# Patient Record
Sex: Male | Born: 1950
Health system: Southern US, Community
[De-identification: ages and names within clinical notes are randomized; demographics above are authoritative.]

## PROBLEM LIST (undated history)

## (undated) DIAGNOSIS — K219 Gastro-esophageal reflux disease without esophagitis: Secondary | ICD-10-CM

## (undated) DIAGNOSIS — E785 Hyperlipidemia, unspecified: Secondary | ICD-10-CM

## (undated) DIAGNOSIS — T7840XA Allergy, unspecified, initial encounter: Secondary | ICD-10-CM

## (undated) HISTORY — DX: Gastro-esophageal reflux disease without esophagitis: K21.9

## (undated) HISTORY — PX: OTHER SURGICAL HISTORY: SHX169

## (undated) HISTORY — DX: Allergy, unspecified, initial encounter: T78.40XA

## (undated) HISTORY — PX: TONSILLECTOMY: SUR1361

## (undated) HISTORY — DX: Hyperlipidemia, unspecified: E78.5

## (undated) HISTORY — PX: MULTIPLE TOOTH EXTRACTIONS: SHX2053

---

## 2001-07-13 ENCOUNTER — Ambulatory Visit (HOSPITAL_COMMUNITY): Admission: RE | Admit: 2001-07-13 | Discharge: 2001-07-13 | Payer: Self-pay | Admitting: Internal Medicine

## 2001-07-13 ENCOUNTER — Encounter: Payer: Self-pay | Admitting: Internal Medicine

## 2005-12-18 ENCOUNTER — Ambulatory Visit (HOSPITAL_COMMUNITY): Admission: RE | Admit: 2005-12-18 | Discharge: 2005-12-18 | Payer: Self-pay | Admitting: Internal Medicine

## 2017-05-14 ENCOUNTER — Ambulatory Visit (INDEPENDENT_AMBULATORY_CARE_PROVIDER_SITE_OTHER): Payer: Self-pay | Admitting: Internal Medicine

## 2017-05-14 ENCOUNTER — Encounter: Payer: Self-pay | Admitting: Internal Medicine

## 2017-05-14 VITALS — BP 134/84 | HR 72 | Temp 97.6°F | Resp 16 | Ht 70.0 in | Wt 179.0 lb

## 2017-05-14 DIAGNOSIS — E782 Mixed hyperlipidemia: Secondary | ICD-10-CM

## 2017-05-14 DIAGNOSIS — G8921 Chronic pain due to trauma: Secondary | ICD-10-CM

## 2017-05-14 DIAGNOSIS — R0989 Other specified symptoms and signs involving the circulatory and respiratory systems: Secondary | ICD-10-CM

## 2017-05-14 DIAGNOSIS — R7309 Other abnormal glucose: Secondary | ICD-10-CM

## 2017-05-14 DIAGNOSIS — E349 Endocrine disorder, unspecified: Secondary | ICD-10-CM

## 2017-05-14 DIAGNOSIS — E559 Vitamin D deficiency, unspecified: Secondary | ICD-10-CM

## 2017-05-14 MED ORDER — TESTOSTERONE CYPIONATE 200 MG/ML IM SOLN: INTRAMUSCULAR | 2 refills | Status: DC

## 2017-05-14 MED ORDER — MELOXICAM 15 MG PO TABS: ORAL_TABLET | ORAL | 1 refills | Status: DC

## 2017-05-14 MED ORDER — PRAVASTATIN SODIUM 40 MG PO TABS: ORAL_TABLET | ORAL | 1 refills | Status: DC

## 2017-05-14 NOTE — Progress Notes (Signed)
Patient ID: Christian Dixon, male   DOB: Jan 23, 1951, 67 y.o.   MRN: [id number]      This very nice 67 y.o. MWM presents to re-establish on return to [place] after living in [place] for the last 10 years. He has been treated for HLD, HTN and Testosterone deficiency. Patient has been on self administered Testosterone replacement by Depo-Testosterone.      Patient's HTN has been labile and he's been on Terazosin for BP and LUTS.  He reports  BP has been controlled. Today's is 146/108 at the wrist and repeated with the wall cuff at 136/88.  Patient has had no complaints of any cardiac type chest pain, palpitations, dyspnea / orthopnea / PND, dizziness, claudication, or dependent edema.     Hyperlipidemia is controlled with diet & meds. Patient denies myalgias or other med SE's.  Records of last lipids brought in by his wife from July 2017 , showed T Chol 159, TG 74, HDL 38 and LDL 72.   Current Outpatient Medications on File Prior to Visit  Medication Sig  . cholecalciferol (VITAMIN D) 1000 units tablet Take 1,000 Units by mouth daily.  . Flaxseed, Linseed, (FLAXSEED OIL PO) Take 1,400 mg by mouth 2 (two) times daily.  . multivitamin (ONE-A-DAY MEN'S) TABS tablet Take 1 tablet by mouth daily.  . Omega-3 Fatty Acids (FISH OIL) 1200 MG CAPS Take by mouth 3 (three) times daily.  Marland Kitchen OVER THE COUNTER MEDICATION Takes allergy tablets PRN  . terazosin (HYTRIN) 10 MG capsule Take 10 mg by mouth at bedtime.   No current facility-administered medications on file prior to visit.    No Known Allergies   PMHx: (+) for labile HTN, HLD, Low Testosterone.   FHx:    Reviewed  SHx:    Reviewed  Systems Review: Constitutional: Denies fever, chills, wt changes, headaches, insomnia, fatigue, night sweats, change in appetite. Eyes: Denies redness, blurred vision, diplopia, discharge, itchy, watery eyes.  ENT: Denies discharge, congestion, post nasal drip, epistaxis, sore throat, earache, hearing loss, dental  pain, tinnitus, vertigo, sinus pain, snoring.  CV: Denies chest pain, palpitations, irregular heartbeat, syncope, dyspnea, diaphoresis, orthopnea, PND, claudication or edema. Respiratory: denies cough, dyspnea, DOE, pleurisy, hoarseness, laryngitis, wheezing.  Gastrointestinal: Denies dysphagia, odynophagia, heartburn, reflux, water brash, abdominal pain or cramps, nausea, vomiting, bloating, diarrhea, constipation, hematemesis, melena, hematochezia  or hemorrhoids. Genitourinary: Denies dysuria, frequency, urgency, nocturia, hesitancy, discharge, hematuria or flank pain. Musculoskeletal: Denies arthralgias, myalgias, stiffness, jt. swelling, pain, limping or strain/sprain.  Skin: Denies pruritus, rash, hives, warts, acne, eczema or change in skin lesion(s). Neuro: No weakness, tremor, incoordination, spasms, paresthesia or pain. Psychiatric: Denies confusion, memory loss or sensory loss. Endo: Denies change in weight, skin or hair change.  Heme/Lymph: No excessive bleeding, bruising or enlarged lymph nodes.  Physical Exam  BP 134/84   Pulse 72   Temp 97.6 F (36.4 C)   Resp 16   Ht 5\' 10"  (1.778 m)   Wt 179 lb (81.2 kg)   BMI 25.68 kg/m   Appears well nourished, well groomed  and in no distress.  Eyes: PERRLA, EOMs, conjunctiva no swelling or erythema. Sinuses: No frontal/maxillary tenderness ENT/Mouth: EAC's clear, TM's nl w/o erythema, bulging. Nares clear w/o erythema, swelling, exudates. Oropharynx clear without erythema or exudates. Oral hygiene is good. Tongue normal, non obstructing. Hearing intact.  Neck: Supple. Thyroid not palpable. Car 2+/2+ without bruits, nodes or JVD. Chest: Respirations nl with BS clear & equal w/o rales, rhonchi, wheezing or stridor.  Cor: Heart sounds normal w/ regular rate and rhythm without sig. murmurs, gallops, clicks or rubs. Peripheral pulses normal and equal  without edema.  Abdomen: Soft & bowel sounds normal. Non-tender w/o guarding,  rebound, hernias, masses or organomegaly.  Lymphatics: Unremarkable.  Musculoskeletal: Full ROM all peripheral extremities, joint stability, 5/5 strength and normal gait.  Skin: Warm, dry without exposed rashes, lesions or ecchymosis apparent.  Neuro: Cranial nerves intact, reflexes equal bilaterally. Sensory-motor testing grossly intact. Tendon reflexes grossly intact.  Pysch: Alert & oriented x 3.  Insight and judgement nl & appropriate. No ideations.  Assessment and Plan:  1. Labile hypertension  - monitor blood pressure at home.   - Reminder to go to the ER if any CP,  SOB, nausea, dizziness, severe HA, changes vision/speech.  2. Hyperlipidemia, mixed - Continue diet/meds, exercise,& lifestyle modifications.   - pravastatin (PRAVACHOL) 40 MG tablet; Take 1 tablet at nite for Cholesterol  Dispense: 90 tablet; Refill: 1  3. Abnormal glucose  - to be determined  4. Vitamin D deficiency  - recc Vit D 10,000 u/day  5. Testosterone deficiency  - testosterone cypionate (DEPOTESTOSTERONE CYPIONATE) 200 MG/ML injection; Inject 2 ml IM every 2 weeks  Dispense: 10 mL; Refill: 2  6. Chronic pain due to trauma  - meloxicam (MOBIC) 15 MG tablet; Take 1/2 to 1 tablet daily with food for pain & inflammation  Dispense: 90 tablet; Refill: 1      Discussed  regular exercise, BP monitoring, weight control to achieve/maintain BMI less than 25 and discussed med and SE's. Patient deferred any labs til he enrolls on Medicare in July this summer.

## 2017-05-14 NOTE — Patient Instructions (Signed)

## 2017-05-15 ENCOUNTER — Telehealth: Payer: Self-pay | Admitting: *Deleted

## 2017-05-15 MED ORDER — BENZONATATE 200 MG PO CAPS: 200.0000 mg | ORAL_CAPSULE | Freq: Three times a day (TID) | ORAL | 0 refills | Status: DC | PRN

## 2017-05-15 NOTE — Telephone Encounter (Signed)
Spouse called and asked about getting a cough medication for patient.  Per Dr Melford Aase, he can take OTC Delsym and RX for Tessalon perles called to the patient's pharmacy.  Spouse is aware.

## 2017-09-30 DIAGNOSIS — Z6826 Body mass index (BMI) 26.0-26.9, adult: Secondary | ICD-10-CM | POA: Insufficient documentation

## 2017-09-30 DIAGNOSIS — E349 Endocrine disorder, unspecified: Secondary | ICD-10-CM | POA: Insufficient documentation

## 2017-09-30 DIAGNOSIS — G8921 Chronic pain due to trauma: Secondary | ICD-10-CM | POA: Insufficient documentation

## 2017-09-30 DIAGNOSIS — R0989 Other specified symptoms and signs involving the circulatory and respiratory systems: Secondary | ICD-10-CM | POA: Insufficient documentation

## 2017-09-30 DIAGNOSIS — E782 Mixed hyperlipidemia: Secondary | ICD-10-CM | POA: Insufficient documentation

## 2017-09-30 DIAGNOSIS — R7309 Other abnormal glucose: Secondary | ICD-10-CM | POA: Insufficient documentation

## 2017-09-30 DIAGNOSIS — Z6825 Body mass index (BMI) 25.0-25.9, adult: Secondary | ICD-10-CM | POA: Insufficient documentation

## 2017-09-30 DIAGNOSIS — E559 Vitamin D deficiency, unspecified: Secondary | ICD-10-CM | POA: Insufficient documentation

## 2017-09-30 NOTE — Progress Notes (Addendum)
MEDICARE ANNUAL WELLNESS VISIT AND FOLLOW UP Assessment:   Diagnoses and all orders for this visit:  Welcome to Medicare preventive visit  Labile hypertension Continue medications Monitor blood pressure at home; call if consistently over 130/80 Continue DASH diet.   Reminder to go to the ER if any CP, SOB, nausea, dizziness, severe HA, changes vision/speech, left arm numbness and tingling and jaw pain.  Hyperlipidemia, mixed Continue medications: pravastatin Continue low cholesterol diet and exercise.  Check lipid panel.   Abnormal glucose Discussed disease and risks Discussed diet/exercise, weight management  A1C  Vitamin D deficiency Continue supplementation for goal of 70-100 Check vitamin D level  Testosterone deficiency continue to monitor, states medication is helping with symptoms of low T.   Chronic pain due to trauma Lower back pain; well managed by chiropractor visits and regular exercise  BMI 25.0-25.9,adult Continue to recommend diet heavy in fruits and veggies and low in animal meats, cheeses, and dairy products, appropriate calorie intake Discuss exercise recommendations routinely Continue to monitor weight at each visit  Former Smoker  EKG, [facility] [place]  Screening colon cancer Colonoscopy- patient declines a colonoscopy even though the risks and benefits were discussed at length. Colon cancer is 3rd most diagnosed cancer and 2nd leading cause of death in both men and women 77 years of age and older. Patient understands the risk of cancer and death with declining the test however they are willing to do cologuard screening instead. They understand that this is not as sensitive or specific as a colonoscopy and they are still recommended to get a colonoscopy. The cologuard will be sent out to their house.    Over 30 minutes of exam, counseling, chart review, and critical decision making was performed  Future Appointments  Date Time Provider [place]   04/22/2018 10:00 AM [name], MD [facility] None     Plan:   During the course of the visit the patient was educated and counseled about appropriate screening and preventive services including:    Pneumococcal vaccine   Influenza vaccine  Prevnar 13  Td vaccine  Screening electrocardiogram  Colorectal cancer screening  Diabetes screening  Glaucoma screening  Nutrition counseling    Subjective:  Christian Dixon is a 67 y.o. male newly established to our office after moving from [place] presents for welcome to medicare visit.   BMI is Body mass index is 26.11 kg/m., he has been working on diet and exercise. Wt Readings from Last 3 Encounters:  10/05/17 182 lb (82.6 kg)  05/14/17 179 lb (81.2 kg)   He has had no labs drawn at our office yet to date for review; we will draw baseline labs today for reported hx of labile hypertension, hyperlipidemia (on pravastatin, omega 3, flax seed), testosterone deficiency and vitamin D deficiency.   His blood pressure has been controlled at home, today their BP is BP: 134/86  He does workout. He denies chest pain, shortness of breath, dizziness.  He has a history of testosterone deficiency and is on testosterone replacement. He states that the testosterone helps with his energy, libido, muscle mass.     Medication Review:  Current Outpatient Medications (Endocrine & Metabolic):  .  testosterone cypionate (DEPOTESTOSTERONE CYPIONATE) 200 MG/ML injection, Inject 2 ml IM every 2 weeks  Current Outpatient Medications (Cardiovascular):  .  pravastatin (PRAVACHOL) 40 MG tablet, Take 1 tablet at nite for Cholesterol .  terazosin (HYTRIN) 10 MG capsule, Take 10 mg by mouth at bedtime.  Current Outpatient Medications (  Respiratory):  .  fluticasone (FLONASE) 50 MCG/ACT nasal spray, Place into both nostrils as needed for allergies or rhinitis. Marland Kitchen  benzonatate (TESSALON) 200 MG capsule, Take 1 capsule (200 mg total) by mouth 3  (three) times daily as needed for cough. (Patient not taking: Reported on 10/05/2017)  Current Outpatient Medications (Analgesics):  .  meloxicam (MOBIC) 15 MG tablet, Take 1/2 to 1 tablet daily with food for pain & inflammation   Current Outpatient Medications (Other):  .  cholecalciferol (VITAMIN D) 1000 units tablet, Take 1,000 Units by mouth daily. .  Flaxseed, Linseed, (FLAXSEED OIL PO), Take 1,400 mg by mouth 2 (two) times daily. .  multivitamin (ONE-A-DAY MEN'S) TABS tablet, Take 1 tablet by mouth daily. .  Omega-3 Fatty Acids (FISH OIL) 1200 MG CAPS, Take by mouth 3 (three) times daily. Marland Kitchen  OVER THE COUNTER MEDICATION, Takes allergy tablets PRN  Allergies: No Known Allergies  Current Problems (verified) has Labile hypertension; Hyperlipidemia, mixed; Abnormal glucose; Vitamin D deficiency; Testosterone deficiency; Chronic pain due to trauma; and BMI 25.0-25.9,adult on their problem list.  Screening Tests  There is no immunization history on file for this patient.  Preventative care: Last colonoscopy: never, has done cologard, will repeat  Prior vaccinations: TD or Tdap: has had in last 10 years, will request records  Influenza: declines  Pneumococcal: 1 year after prevnar Prevnar13: due next year Shingles/Zostavax: declines   Names of Other Physician/Practitioners you currently use: 1. [facility] here for primary care 2. - , eye doctor, last visit remote, has 65 year old glasses  3. - , dentist, last visit remote, full dentures  Patient Care Team: [name], MD as PCP - General (Internal Medicine)  Surgical: He  has no past surgical history on file. Family His family history is not on file. Social history  He reports that he has quit smoking. He has never used smokeless tobacco. He reports that he does not drink alcohol. His drug history is not on file.  MEDICARE WELLNESS OBJECTIVES: Physical activity: Current  Exercise Habits: Home exercise routine, Type of exercise: strength training/weights;Other - see comments(martial arts), Time (Minutes): 60, Frequency (Times/Week): 5, Weekly Exercise (Minutes/Week): 300, Intensity: Moderate, Exercise limited by: None identified Cardiac risk factors: Cardiac Risk Factors include: advanced age (>50men, >68 women);dyslipidemia;male gender;hypertension;smoking/ tobacco exposure Depression/mood screen:   Depression screen [facility] 2/9 10/05/2017  Decreased Interest 0  Down, Depressed, Hopeless 0  PHQ - 2 Score 0    ADLs:  In your present state of health, do you have any difficulty performing the following activities: 10/05/2017 05/14/2017  Hearing? N N  Vision? N N  Difficulty concentrating or making decisions? N N  Walking or climbing stairs? N N  Dressing or bathing? N N  Doing errands, shopping? N N  Some recent data might be hidden     Cognitive Testing  Alert? Yes  Normal Appearance?Yes  Oriented to person? Yes  Place? Yes   Time? Yes  Recall of three objects?  Yes  Can perform simple calculations? Yes  Displays appropriate judgment?Yes  Can read the correct time from a watch face?Yes  EOL planning: Does Patient Have a Medical Advance Directive?: No Would patient like information on creating a medical advance directive?: No - Patient declined   Objective:   Today's Vitals   10/05/17 0951  BP: 134/86  Pulse: 70  Temp: (!) 97.5 F (36.4 C)  SpO2: 97%  Weight: 182 lb (82.6 kg)  Height: 5'  10" (1.778 m)  PainSc: 2   PainLoc: Arm   Body mass index is 26.11 kg/m.  General appearance: alert, no distress, WD/WN, male HEENT: normocephalic, sclerae anicteric, TMs pearly, nares patent, no discharge or erythema, pharynx normal Oral cavity: MMM, no lesions Neck: supple, no lymphadenopathy, no thyromegaly, no masses Heart: RRR, normal S1, S2, no murmurs Lungs: CTA bilaterally, no wheezes, rhonchi, or rales Abdomen: +bs, soft, non tender, non distended,  no masses, no hepatomegaly, no splenomegaly Musculoskeletal: nontender, no swelling, no obvious deformity Extremities: no edema, no cyanosis, no clubbing Pulses: 2+ symmetric, upper and lower extremities, normal cap refill Neurological: alert, oriented x 3, CN2-12 intact, strength normal upper extremities and lower extremities, sensation normal throughout, DTRs 2+ throughout, no cerebellar signs, gait normal Psychiatric: normal affect, behavior normal, pleasant   Medicare Attestation I have personally reviewed: The patient's medical and social history Their use of alcohol, tobacco or illicit drugs Their current medications and supplements The patient's functional ability including ADLs,fall risks, home safety risks, cognitive, and hearing and visual impairment Diet and physical activities Evidence for depression or mood disorders  The patient's weight, height, BMI, and visual acuity have been recorded in the chart.  I have made referrals, counseling, and provided education to the patient based on review of the above and I have provided the patient with a written personalized care plan for preventive services.     Izora Ribas, NP   10/05/2017

## 2017-10-05 ENCOUNTER — Encounter: Payer: Self-pay | Admitting: Adult Health

## 2017-10-05 ENCOUNTER — Ambulatory Visit (INDEPENDENT_AMBULATORY_CARE_PROVIDER_SITE_OTHER): Payer: PPO | Admitting: Adult Health

## 2017-10-05 VITALS — BP 134/86 | HR 70 | Temp 97.5°F | Ht 70.0 in | Wt 182.0 lb

## 2017-10-05 DIAGNOSIS — Z6825 Body mass index (BMI) 25.0-25.9, adult: Secondary | ICD-10-CM | POA: Diagnosis not present

## 2017-10-05 DIAGNOSIS — Z136 Encounter for screening for cardiovascular disorders: Secondary | ICD-10-CM | POA: Diagnosis not present

## 2017-10-05 DIAGNOSIS — E782 Mixed hyperlipidemia: Secondary | ICD-10-CM

## 2017-10-05 DIAGNOSIS — R6889 Other general symptoms and signs: Secondary | ICD-10-CM

## 2017-10-05 DIAGNOSIS — Z0000 Encounter for general adult medical examination without abnormal findings: Secondary | ICD-10-CM

## 2017-10-05 DIAGNOSIS — Z0001 Encounter for general adult medical examination with abnormal findings: Secondary | ICD-10-CM

## 2017-10-05 DIAGNOSIS — G8921 Chronic pain due to trauma: Secondary | ICD-10-CM

## 2017-10-05 DIAGNOSIS — R7309 Other abnormal glucose: Secondary | ICD-10-CM | POA: Diagnosis not present

## 2017-10-05 DIAGNOSIS — E559 Vitamin D deficiency, unspecified: Secondary | ICD-10-CM | POA: Diagnosis not present

## 2017-10-05 DIAGNOSIS — R0989 Other specified symptoms and signs involving the circulatory and respiratory systems: Secondary | ICD-10-CM | POA: Diagnosis not present

## 2017-10-05 DIAGNOSIS — Z79899 Other long term (current) drug therapy: Secondary | ICD-10-CM | POA: Diagnosis not present

## 2017-10-05 DIAGNOSIS — I444 Left anterior fascicular block: Secondary | ICD-10-CM | POA: Insufficient documentation

## 2017-10-05 DIAGNOSIS — E349 Endocrine disorder, unspecified: Secondary | ICD-10-CM

## 2017-10-05 DIAGNOSIS — I10 Essential (primary) hypertension: Secondary | ICD-10-CM | POA: Diagnosis not present

## 2017-10-05 DIAGNOSIS — Z1211 Encounter for screening for malignant neoplasm of colon: Secondary | ICD-10-CM

## 2017-10-05 DIAGNOSIS — F17200 Nicotine dependence, unspecified, uncomplicated: Secondary | ICD-10-CM

## 2017-10-05 NOTE — Patient Instructions (Signed)
Recommend getting an eye appointment      Aim for 7+ servings of fruits and vegetables daily  80+ fluid ounces of water or unsweet tea for healthy kidneys  Limit alcohol, avoid smoking  Limit animal fats in diet for cholesterol and heart health - choose grass fed whenever available  Aim for low stress - take time to unwind and care for your mental health  Aim for 150 min of moderate intensity exercise weekly for heart health, and weights twice weekly for bone health  Aim for 7-9 hours of sleep daily      When it comes to diets, agreement about the perfect plan isn't easy to find, even among the experts. Experts at the [employer] developed an idea known as the Healthy Eating Plate. Just imagine a plate divided into logical, healthy portions.  The emphasis is on diet quality:  Load up on vegetables and fruits - one-half of your plate: Aim for color and variety, and remember that potatoes don't count.  Go for whole grains - one-quarter of your plate: Whole wheat, barley, wheat berries, quinoa, oats, brown rice, and foods made with them. If you want pasta, go with whole wheat pasta.  Protein power - one-quarter of your plate: Fish, chicken, beans, and nuts are all healthy, versatile protein sources. Limit red meat.  The diet, however, does go beyond the plate, offering a few other suggestions.  Use healthy plant oils, such as olive, canola, soy, corn, sunflower and peanut. Check the labels, and avoid partially hydrogenated oil, which have unhealthy trans fats.  If you're thirsty, drink water. Coffee and tea are good in moderation, but skip sugary drinks and limit milk and dairy products to one or two daily servings.  The type of carbohydrate in the diet is more important than the amount. Some sources of carbohydrates, such as vegetables, fruits, whole grains, and beans-are healthier than others.  Finally, stay active.

## 2017-10-06 ENCOUNTER — Other Ambulatory Visit: Payer: Self-pay | Admitting: Adult Health

## 2017-10-06 ENCOUNTER — Telehealth: Payer: Self-pay

## 2017-10-06 LAB — CBC WITH DIFFERENTIAL/PLATELET
Basophils Absolute: 41 cells/uL (ref 0–200)
Basophils Relative: 0.8 %
Eosinophils Absolute: 194 cells/uL (ref 15–500)
Eosinophils Relative: 3.8 %
HCT: 50.2 % — ABNORMAL HIGH (ref 38.5–50.0)
Hemoglobin: 17.2 g/dL — ABNORMAL HIGH (ref 13.2–17.1)
Lymphs Abs: 1265 cells/uL (ref 850–3900)
MCH: 31.5 pg (ref 27.0–33.0)
MCHC: 34.3 g/dL (ref 32.0–36.0)
MCV: 91.9 fL (ref 80.0–100.0)
MPV: 9.7 fL (ref 7.5–12.5)
Monocytes Relative: 10.7 %
NEUTROS PCT: 59.9 %
Neutro Abs: 3055 cells/uL (ref 1500–7800)
PLATELETS: 281 10*3/uL (ref 140–400)
RBC: 5.46 10*6/uL (ref 4.20–5.80)
RDW: 12.1 % (ref 11.0–15.0)
TOTAL LYMPHOCYTE: 24.8 %
WBC: 5.1 10*3/uL (ref 3.8–10.8)
WBCMIX: 546 {cells}/uL (ref 200–950)

## 2017-10-06 LAB — COMPLETE METABOLIC PANEL WITH GFR
AG RATIO: 2 (calc) (ref 1.0–2.5)
ALT: 35 U/L (ref 9–46)
AST: 31 U/L (ref 10–35)
Albumin: 4.8 g/dL (ref 3.6–5.1)
Alkaline phosphatase (APISO): 47 U/L (ref 40–115)
BILIRUBIN TOTAL: 1.6 mg/dL — AB (ref 0.2–1.2)
BUN/Creatinine Ratio: 15 (calc) (ref 6–22)
BUN: 19 mg/dL (ref 7–25)
CALCIUM: 9.8 mg/dL (ref 8.6–10.3)
CHLORIDE: 103 mmol/L (ref 98–110)
CO2: 27 mmol/L (ref 20–32)
Creat: 1.27 mg/dL — ABNORMAL HIGH (ref 0.70–1.25)
GFR, Est African American: 69 mL/min/{1.73_m2} (ref >=60)
GFR, Est Non African American: 59 mL/min/{1.73_m2} — ABNORMAL LOW (ref >=60)
GLUCOSE: 99 mg/dL (ref 65–99)
Globulin: 2.4 g/dL (calc) (ref 1.9–3.7)
POTASSIUM: 4.8 mmol/L (ref 3.5–5.3)
Sodium: 142 mmol/L (ref 135–146)
Total Protein: 7.2 g/dL (ref 6.1–8.1)

## 2017-10-06 LAB — LIPID PANEL
Cholesterol: 164 mg/dL (ref <200)
HDL: 37 mg/dL — AB (ref >40)
LDL Cholesterol (Calc): 106 mg/dL (calc) — ABNORMAL HIGH
Non-HDL Cholesterol (Calc): 127 mg/dL (calc) (ref <130)
TRIGLYCERIDES: 113 mg/dL (ref <150)
Total CHOL/HDL Ratio: 4.4 (calc) (ref <5.0)

## 2017-10-06 LAB — TESTOSTERONE: Testosterone: 345 ng/dL (ref 250–827)

## 2017-10-06 LAB — HEMOGLOBIN A1C
EAG (MMOL/L): 5.8 (calc)
Hgb A1c MFr Bld: 5.3 % of total Hgb (ref <5.7)
Mean Plasma Glucose: 105 (calc)

## 2017-10-06 LAB — VITAMIN D 25 HYDROXY (VIT D DEFICIENCY, FRACTURES): Vit D, 25-Hydroxy: 64 ng/mL (ref 30–100)

## 2017-10-06 LAB — TSH: TSH: 1.77 mIU/L (ref 0.40–4.50)

## 2017-10-06 MED ORDER — PREDNISONE 20 MG PO TABS: ORAL_TABLET | ORAL | 0 refills | Status: AC

## 2017-10-06 NOTE — Telephone Encounter (Signed)
Patient notified

## 2017-10-06 NOTE — Telephone Encounter (Signed)
Since patient was advised to not take any type of Nsaid's for pain, asking if Prednisone would help?

## 2017-10-13 NOTE — Progress Notes (Signed)
Left message on voicemail.

## 2017-10-27 DIAGNOSIS — Z1211 Encounter for screening for malignant neoplasm of colon: Secondary | ICD-10-CM | POA: Diagnosis not present

## 2017-10-28 LAB — COLOGUARD: COLOGUARD: NEGATIVE

## 2017-11-02 ENCOUNTER — Other Ambulatory Visit: Payer: Self-pay | Admitting: *Deleted

## 2017-11-02 MED ORDER — TERAZOSIN HCL 10 MG PO CAPS: 10.0000 mg | ORAL_CAPSULE | Freq: Every day | ORAL | 0 refills | Status: DC

## 2017-11-09 ENCOUNTER — Telehealth: Payer: Self-pay

## 2017-11-09 ENCOUNTER — Encounter: Payer: Self-pay | Admitting: Adult Health

## 2017-11-09 NOTE — Telephone Encounter (Signed)
Entered in error

## 2017-11-17 ENCOUNTER — Other Ambulatory Visit: Payer: Self-pay | Admitting: Internal Medicine

## 2017-11-17 DIAGNOSIS — E782 Mixed hyperlipidemia: Secondary | ICD-10-CM

## 2017-11-25 ENCOUNTER — Other Ambulatory Visit: Payer: Self-pay | Admitting: Internal Medicine

## 2017-11-25 MED ORDER — BENZONATATE 200 MG PO CAPS: ORAL_CAPSULE | ORAL | 1 refills | Status: DC

## 2017-12-07 ENCOUNTER — Encounter: Payer: Self-pay | Admitting: Physician Assistant

## 2017-12-07 ENCOUNTER — Ambulatory Visit (HOSPITAL_COMMUNITY)
Admission: RE | Admit: 2017-12-07 | Discharge: 2017-12-07 | Disposition: A | Discharge status: Home / Self Care | Payer: PPO | Source: Ambulatory Visit | Attending: Physician Assistant | Admitting: Physician Assistant

## 2017-12-07 ENCOUNTER — Ambulatory Visit (INDEPENDENT_AMBULATORY_CARE_PROVIDER_SITE_OTHER): Payer: PPO | Admitting: Physician Assistant

## 2017-12-07 VITALS — BP 120/80 | HR 67 | Temp 99.2°F | Resp 18 | Ht 70.0 in | Wt 187.0 lb

## 2017-12-07 DIAGNOSIS — R059 Cough, unspecified: Secondary | ICD-10-CM

## 2017-12-07 DIAGNOSIS — R05 Cough: Secondary | ICD-10-CM | POA: Insufficient documentation

## 2017-12-07 MED ORDER — AZITHROMYCIN 250 MG PO TABS: ORAL_TABLET | ORAL | 1 refills | Status: AC

## 2017-12-07 MED ORDER — PROMETHAZINE-DM 6.25-15 MG/5ML PO SYRP: 5.0000 mL | ORAL_SOLUTION | Freq: Four times a day (QID) | ORAL | 1 refills | Status: DC | PRN

## 2017-12-07 MED ORDER — PREDNISONE 20 MG PO TABS: ORAL_TABLET | ORAL | 0 refills | Status: DC

## 2017-12-07 NOTE — Patient Instructions (Signed)
INFORMATION ABOUT YOUR XRAY  [facility] [place] go to [facility]ology and give them your name. They will have the order and take you back. You do not any paper work, I should get the result back today or tomorrow. This order is good for a year.     Start on zantac 150-300 mg OR pepcid 20 or 40mg  at night for 2-4 weeks, then you can stop or continue as needed.  Avoid alcohol, spicy foods, NSAIDS (aleve, ibuprofen) at this time. See foods below.   Silent reflux: Not all heartburn burns...Marland KitchenMarland KitchenMarland Kitchen  What is LPR? Laryngopharyngeal reflux (LPR) or silent reflux is a condition in which acid that is made in the stomach travels up the esophagus (swallowing tube) and gets to the throat. Not everyone with reflux has a lot of heartburn or indigestion. In fact, many people with LPR never have heartburn. This is why LPR is called SILENT REFLUX, and the terms "Silent reflux" and "LPR" are often used interchangeably. Because LPR is silent, it is sometimes difficult to diagnose.  How can you tell if you have LPR?  Marland Kitchen Chronic hoarseness- Some people have hoarseness that comes and goes . throat clearing  . Cough . It can cause shortness of breath and cause asthma like symptoms. Marland Kitchen a feeling of a lump in the throat  . difficulty swallowing . a problem with too much nose and throat drainage.  . Some people will feel their esophagus spasm which feels like their heart beating hard and fast, this will usually be after a meal, at rest, or lying down at night.    How do I treat this? Treatment for LPR should be individualized, and your doctor will suggest the best treatment for you. Generally there are several treatments for LPR: . changing habits and diet to reduce reflux,  . medications to reduce stomach acid, and  . surgery to prevent reflux. Most people with LPR need to modify how and when they eat, as well as take some medication, to get well. Sometimes, nonprescription liquid antacids, such as Maalox,  Gelucil and Mylanta are recommended. When used, these antacids should be taken four times each day - one tablespoon one hour after each meal and before bedtime. Dietary and lifestyle changes alone are not often enough to control LPR - medications that reduce stomach acid are also usually needed. These must be prescribed by our doctor.   TIPS FOR REDUCING REFLUX AND LPR Control your LIFE-STYLE and your DIET! Marland Kitchen If you use tobacco, QUIT.  Marland Kitchen Smoking makes you reflux. After every cigarette you have some LPR.  . Don't wear clothing that is too tight, especially around the waist (trousers, corsets, belts).  . Do not lie down just after eating...in fact, do not eat within three hours of bedtime.  . You should be on a low-fat diet.  . Limit your intake of red meat.  . Limit your intake of butter.  Marland Kitchen Avoid fried foods.  . Avoid chocolate  . Avoid cheese.  Marland Kitchen Avoid eggs. Marland Kitchen Specifically avoid caffeine (especially coffee and tea), soda pop (especially cola) and mints.  . Avoid alcoholic beverages, particularly in the evening.   Food Choices for Gastroesophageal Reflux Disease When you have gastroesophageal reflux disease (GERD), the foods you eat and your eating habits are very important. Choosing the right foods can help ease the discomfort of GERD. WHAT GENERAL GUIDELINES DO I NEED TO FOLLOW?  Choose fruits, vegetables, whole grains, low-fat dairy products, and low-fat  meat, fish, and poultry.  Limit fats such as oils, salad dressings, butter, nuts, and avocado.  Keep a food diary to identify foods that cause symptoms.  Avoid foods that cause reflux. These may be different for different people.  Eat frequent small meals instead of three large meals each day.  Eat your meals slowly, in a relaxed setting.  Limit fried foods.  Cook foods using methods other than frying.  Avoid drinking alcohol.  Avoid drinking large amounts of liquids with your meals.  Avoid bending over or lying down  until 2-3 hours after eating. WHAT FOODS ARE NOT RECOMMENDED? The following are some foods and drinks that may worsen your symptoms: Vegetables Tomatoes. Tomato juice. Tomato and spaghetti sauce. Chili peppers. Onion and garlic. Horseradish. Fruits Oranges, grapefruit, and lemon (fruit and juice). Meats High-fat meats, fish, and poultry. This includes hot dogs, ribs, ham, sausage, salami, and bacon. Dairy Whole milk and chocolate milk. Sour cream. Cream. Butter. Ice cream. Cream cheese.  Beverages Coffee and tea, with or without caffeine. Carbonated beverages or energy drinks. Condiments Hot sauce. Barbecue sauce.  Sweets/Desserts Chocolate and cocoa. Donuts. Peppermint and spearmint. Fats and Oils High-fat foods, including [place] fries and potato chips. Other Vinegar. Strong spices, such as black pepper, white pepper, red pepper, cayenne, curry powder, cloves, ginger, and chili powder.

## 2017-12-07 NOTE — Progress Notes (Signed)
Subjective:    Patient ID: Christian Dixon, male    DOB: [birth year], 67 y.o.   MRN: [id number]  HPI 67 y.o. WM presents with URI x 2 weeks. Mucinex is not helping, not on nasal spray, tessalon helps some but not much. Last CXR 2007, history of smoking 15+ years ago. No fever, chills, some SOB, no CP. Worse at night with lying down. He also has GERD with sensation of something in his throat x  Months. No choking on foods/liquids, no weight loss.   Wt Readings from Last 3 Encounters:  12/07/17 187 lb (84.8 kg)  10/05/17 182 lb (82.6 kg)  05/14/17 179 lb (81.2 kg)     Blood pressure 120/80, pulse 67, temperature 99.2 F (37.3 C), resp. rate 18, height 5\' 10"  (1.778 m), weight 187 lb (84.8 kg), SpO2 95 %.  Medications Current Outpatient Medications on File Prior to Visit  Medication Sig  . benzonatate (TESSALON) 200 MG capsule Take 1 perle 3 x / day to prevent cough  . cholecalciferol (VITAMIN D) 1000 units tablet Take 1,000 Units by mouth daily.  . Flaxseed, Linseed, (FLAXSEED OIL PO) Take 1,400 mg by mouth 2 (two) times daily.  . fluticasone (FLONASE) 50 MCG/ACT nasal spray Place into both nostrils as needed for allergies or rhinitis.  . meloxicam (MOBIC) 15 MG tablet Take 1/2 to 1 tablet daily with food for pain & inflammation  . multivitamin (ONE-A-DAY MEN'S) TABS tablet Take 1 tablet by mouth daily.  . Omega-3 Fatty Acids (FISH OIL) 1200 MG CAPS Take by mouth 3 (three) times daily.  Marland Kitchen OVER THE COUNTER MEDICATION Takes allergy tablets PRN  . pravastatin (PRAVACHOL) 40 MG tablet TAKE 1 TABLET BY MOUTH AT BEDTIME  . terazosin (HYTRIN) 10 MG capsule Take 1 capsule (10 mg total) by mouth at bedtime.  Marland Kitchen testosterone cypionate (DEPOTESTOSTERONE CYPIONATE) 200 MG/ML injection Inject 2 ml IM every 2 weeks   No current facility-administered medications on file prior to visit.     Problem list He has Labile hypertension; Hyperlipidemia, mixed; Vitamin D deficiency; Testosterone  deficiency; Chronic pain due to trauma; BMI 25.0-25.9,adult; and Left anterior fascicular block (LAFB) on their problem list.  Review of Systems See HPI    Objective:   Physical Exam  Constitutional: He is oriented to person, place, and time. He appears well-developed and well-nourished.  HENT:  Head: Normocephalic and atraumatic.  Right Ear: External ear normal.  Left Ear: External ear normal.  Nose: Nose normal.  Mouth/Throat: Oropharynx is clear and moist.  Eyes: Pupils are equal, round, and reactive to light. Conjunctivae are normal.  Neck: Normal range of motion. Neck supple.  Cardiovascular: Normal rate, regular rhythm and normal heart sounds.  No murmur heard. Pulmonary/Chest: Effort normal. No respiratory distress. He has wheezes. He has no rales. He exhibits no tenderness.  Abdominal: Soft. Bowel sounds are normal.  Lymphadenopathy:    He has no cervical adenopathy.  Neurological: He is alert and oriented to person, place, and time.  Skin: Skin is warm and dry.          Assessment & Plan:    Cough -     DG Chest 2 View; Future - get on zantac if GERD not better in 4 weeks will refer -     predniSONE (DELTASONE) 20 MG tablet; 2 tablets daily for 3 days, 1 tablet daily for 4 days. -     promethazine-dextromethorphan (PROMETHAZINE-DM) 6.25-15 MG/5ML syrup; Take 5 mLs by mouth 4 (  four) times daily as needed for cough. -     azithromycin (ZITHROMAX) 250 MG tablet; Take 2 tablets (500 mg) on  Day 1,  followed by 1 tablet (250 mg) once daily on Days 2 through 5.

## 2017-12-17 ENCOUNTER — Other Ambulatory Visit: Payer: Self-pay

## 2017-12-17 DIAGNOSIS — G8921 Chronic pain due to trauma: Secondary | ICD-10-CM

## 2017-12-17 MED ORDER — MELOXICAM 15 MG PO TABS: ORAL_TABLET | ORAL | 1 refills | Status: DC

## 2018-01-03 ENCOUNTER — Other Ambulatory Visit: Payer: Self-pay | Admitting: Internal Medicine

## 2018-01-03 DIAGNOSIS — E349 Endocrine disorder, unspecified: Secondary | ICD-10-CM

## 2018-01-12 ENCOUNTER — Telehealth: Payer: Self-pay

## 2018-01-12 NOTE — Telephone Encounter (Signed)
-----   Message from Vicie Mutters, [place] sent at 01/12/2018 12:23 PM EDT ----- Regarding: RE: ABX rx needed Make sure he is on pepcid over the counter 2 a day. There was a refill on the zpak, did they get that? If not, take that, if they did we can send in different antbiotic. If he is not better, he will need a visit after that.  Estill Bamberg  ----- Message ----- From: Elenor Quinones, CMA Sent: 01/12/2018  12:05 PM EDT To: Vicie Mutters, PA-C Subject: ABX rx needed                                  Pt's wife is calling stating that her husband's chest congestion never went away & thinks he may need another round of ABX.  [facility]: [facility]

## 2018-01-12 NOTE — Telephone Encounter (Signed)
Pt's wife will pick up the Zpak & her husband will start taking 2 tablet of PEPCID. & if he is not doing better he will come in

## 2018-02-09 ENCOUNTER — Other Ambulatory Visit: Payer: Self-pay | Admitting: Adult Health

## 2018-02-09 DIAGNOSIS — E782 Mixed hyperlipidemia: Secondary | ICD-10-CM

## 2018-03-01 ENCOUNTER — Other Ambulatory Visit: Payer: Self-pay | Admitting: Internal Medicine

## 2018-03-01 MED ORDER — FAMOTIDINE 40 MG PO TABS: ORAL_TABLET | ORAL | 3 refills | Status: DC

## 2018-03-02 ENCOUNTER — Other Ambulatory Visit: Payer: Self-pay | Admitting: Internal Medicine

## 2018-03-02 DIAGNOSIS — N138 Other obstructive and reflux uropathy: Secondary | ICD-10-CM

## 2018-03-02 DIAGNOSIS — N401 Enlarged prostate with lower urinary tract symptoms: Principal | ICD-10-CM

## 2018-03-02 DIAGNOSIS — K219 Gastro-esophageal reflux disease without esophagitis: Secondary | ICD-10-CM

## 2018-03-02 MED ORDER — TERAZOSIN HCL 10 MG PO CAPS: ORAL_CAPSULE | ORAL | 3 refills | Status: DC

## 2018-03-02 MED ORDER — CIMETIDINE 400 MG PO TABS: ORAL_TABLET | ORAL | 1 refills | Status: DC

## 2018-04-22 ENCOUNTER — Ambulatory Visit (INDEPENDENT_AMBULATORY_CARE_PROVIDER_SITE_OTHER): Payer: PPO | Admitting: Internal Medicine

## 2018-04-22 ENCOUNTER — Encounter: Payer: Self-pay | Admitting: Internal Medicine

## 2018-04-22 DIAGNOSIS — R69 Illness, unspecified: Secondary | ICD-10-CM | POA: Diagnosis not present

## 2018-04-22 NOTE — Progress Notes (Signed)
R  E  S  C  H  E  D  U  L  E D     M  O  R  N  I  N  G     O  f     A  P  P  '  T                                                                                                                                                                                                                                                                                                                                                                          This very nice 68 y.o.male presents for a Screening /Preventative Visit & comprehensive evaluation and management of multiple medical co-morbidities.  Patient has been followed for HTN, HLD, Prediabetes and Vitamin D Deficiency.     HTN predates since     . Patient's BP has been controlled at home.  Today's  . Patient denies any cardiac symptoms as chest pain, palpitations, shortness of breath, dizziness or ankle swelling.     Patient's hyperlipidemia is controlled with diet and medications. Patient denies myalgias or other medication SE's. Last lipids were  Lab Results  Component Value Date   CHOL 164 10/05/2017   HDL 37 (L) 10/05/2017   LDLCALC 106 (H) 10/05/2017   TRIG 113 10/05/2017   CHOLHDL 4.4 10/05/2017      Patient has hx/o prediabetes since    and patient denies reactive hypoglycemic symptoms, visual blurring, diabetic polys or paresthesias. Last A1c was  Lab Results  Component Value Date   HGBA1C 5.3 10/05/2017       Finally, patient has history of  Vitamin D Deficiency of    and last vitamin D was  Lab Results  Component Value Date   VD25OH 64 10/05/2017

## 2018-05-12 ENCOUNTER — Ambulatory Visit (INDEPENDENT_AMBULATORY_CARE_PROVIDER_SITE_OTHER): Payer: PPO | Admitting: Internal Medicine

## 2018-05-12 ENCOUNTER — Encounter: Payer: Self-pay | Admitting: Internal Medicine

## 2018-05-12 VITALS — BP 122/80 | HR 71 | Temp 97.9°F | Ht 69.0 in | Wt 194.4 lb

## 2018-05-12 DIAGNOSIS — N401 Enlarged prostate with lower urinary tract symptoms: Secondary | ICD-10-CM

## 2018-05-12 DIAGNOSIS — R7309 Other abnormal glucose: Secondary | ICD-10-CM

## 2018-05-12 DIAGNOSIS — Z79899 Other long term (current) drug therapy: Secondary | ICD-10-CM

## 2018-05-12 DIAGNOSIS — E559 Vitamin D deficiency, unspecified: Secondary | ICD-10-CM

## 2018-05-12 DIAGNOSIS — Z0000 Encounter for general adult medical examination without abnormal findings: Secondary | ICD-10-CM | POA: Diagnosis not present

## 2018-05-12 DIAGNOSIS — Z1211 Encounter for screening for malignant neoplasm of colon: Secondary | ICD-10-CM

## 2018-05-12 DIAGNOSIS — Z1212 Encounter for screening for malignant neoplasm of rectum: Secondary | ICD-10-CM

## 2018-05-12 DIAGNOSIS — E349 Endocrine disorder, unspecified: Secondary | ICD-10-CM

## 2018-05-12 DIAGNOSIS — Z136 Encounter for screening for cardiovascular disorders: Secondary | ICD-10-CM

## 2018-05-12 DIAGNOSIS — R0989 Other specified symptoms and signs involving the circulatory and respiratory systems: Secondary | ICD-10-CM

## 2018-05-12 DIAGNOSIS — K219 Gastro-esophageal reflux disease without esophagitis: Secondary | ICD-10-CM

## 2018-05-12 DIAGNOSIS — Z87891 Personal history of nicotine dependence: Secondary | ICD-10-CM

## 2018-05-12 DIAGNOSIS — N138 Other obstructive and reflux uropathy: Secondary | ICD-10-CM | POA: Diagnosis not present

## 2018-05-12 DIAGNOSIS — I10 Essential (primary) hypertension: Secondary | ICD-10-CM | POA: Diagnosis not present

## 2018-05-12 DIAGNOSIS — E782 Mixed hyperlipidemia: Secondary | ICD-10-CM | POA: Diagnosis not present

## 2018-05-12 DIAGNOSIS — Z125 Encounter for screening for malignant neoplasm of prostate: Secondary | ICD-10-CM

## 2018-05-12 NOTE — Patient Instructions (Signed)

## 2018-05-12 NOTE — Progress Notes (Signed)
[facility]   [name], M.D.     Uvaldo Bristle. Silverio Lay, P.A.-C [name], [place]                [address] [place] [address] Telephone [phone] Telefax [phone] Annual  Screening/Preventative Visit  & Comprehensive Evaluation & Examination     This very nice 68 y.o. MWM presents for a Screening /Preventative Visit & comprehensive evaluation and management of multiple medical co-morbidities.  Patient has been followed for HTN, HLD, Testosterone Deficiency  Prediabetes and Vitamin D Deficiency.     Patient has hx/o labile HTN (2005) & has been on Hytrin for his BP & LUTS. Patient's BP has been controlled at home.  Today's BP is at goal - 122/80. Patient denies any cardiac symptoms as chest pain, palpitations, shortness of breath, dizziness or ankle swelling.     Patient's hyperlipidemia is controlled with diet and medications. Patient denies myalgias or other medication SE's. Last lipids were at goal: Lab Results  Component Value Date   CHOL 139 05/12/2018   HDL 28 (L) 05/12/2018   LDLCALC 88 05/12/2018   TRIG 131 05/12/2018   CHOLHDL 5.0 (H) 05/12/2018      Patient is monitored expectantly for glucose intolerance and patient denies reactive hypoglycemic symptoms, visual blurring, diabetic polys or paresthesias. Last A1c was at goal: Lab Results  Component Value Date   HGBA1C 5.1 05/12/2018           Patient has hx/o Testosterone Deficiency and has been on Testosterone replacement by Depo-Testosterone.     Finally, patient has history of Vitamin D Deficiency and last vitamin D was at goal: Lab Results  Component Value Date   VD25OH 63 05/12/2018   Current Outpatient Medications on File Prior to Visit  Medication Sig  . cholecalciferol (VITAMIN D) 1000 units tablet Take 1,000 Units by mouth daily.  . cimetidine (TAGAMET) 400 MG tablet Take 1 tablet 2 x  /day for  acid indigestion & Reflux  . Flaxseed, Linseed, (FLAXSEED OIL PO) Take 1,400 mg by mouth 2 (two) times daily.  . fluticasone (FLONASE) 50 MCG/ACT nasal spray Place into both nostrils as needed for allergies or rhinitis.  . meloxicam (MOBIC) 15 MG tablet Take 1/2 to 1 tablet daily with food for pain & inflammation  . multivitamin (ONE-A-DAY MEN'S) TABS tablet Take 1 tablet by mouth daily.  . Omega-3 Fatty Acids (FISH OIL) 1200 MG CAPS Take by mouth 3 (three) times daily.  Marland Kitchen OVER THE COUNTER MEDICATION Takes allergy tablets PRN  . terazosin (HYTRIN) 10 MG capsule Take 1 capsule at bedtime for prostate.  . testosterone cypionate (DEPOTESTOSTERONE CYPIONATE) 200 MG/ML injection INJECT 2 MLS INTRAMUSCULARLY EVERY 2 WEEK   No current facility-administered medications on file prior to visit.    No Known Allergies   History reviewed. No pertinent past medical history.   Health Maintenance  Topic Date Due  . PNA vac Low Risk Adult (1 of 2 - PCV13) 10/28/2017  . INFLUENZA VACCINE  10/29/2017  . COLONOSCOPY  10/06/2018 (Originally 10/29/2002)  . TETANUS/TDAP  10/06/2018 (Originally 10/29/1971)  . Hepatitis C Screening  10/06/2018 (Originally 1950-10-16)  . HIV Screening  Discontinued   Patient refuses any Vaccinations   Last Colon - Patient refuses  History reviewed. No pertinent surgical history.   History reviewed. No pertinent family history.  Social History   Socioeconomic History  . Marital status: Single  . Number of children: 3 sons  Occupational History  . Retired   Tobacco Use  . Smoking status: Former Research scientist (life sciences)  . Smokeless tobacco: Never Used  Substance and Sexual Activity  . Alcohol use: No    Frequency: Never  . Drug use: Not on file  . Sexual activity: Not on file    ROS Constitutional: Denies fever, chills, weight loss/gain, headaches, insomnia,  night sweats or change in appetite. Does c/o fatigue. Eyes: Denies redness, blurred vision, diplopia, discharge, itchy or  watery eyes.  ENT: Denies discharge, congestion, post nasal drip, epistaxis, sore throat, earache, hearing loss, dental pain, Tinnitus, Vertigo, Sinus pain or snoring.  Cardio: Denies chest pain, palpitations, irregular heartbeat, syncope, dyspnea, diaphoresis, orthopnea, PND, claudication or edema Respiratory: denies cough, dyspnea, DOE, pleurisy, hoarseness, laryngitis or wheezing.  Gastrointestinal: Denies dysphagia, heartburn, reflux, water brash, pain, cramps, nausea, vomiting, bloating, diarrhea, constipation, hematemesis, melena, hematochezia, jaundice or hemorrhoids Genitourinary: Denies dysuria, frequency, urgency, nocturia, hesitancy, discharge, hematuria or flank pain Musculoskeletal: Denies arthralgia, myalgia, stiffness, Jt. Swelling, pain, limp or strain/sprain. Denies Falls. Skin: Denies puritis, rash, hives, warts, acne, eczema or change in skin lesion Neuro: No weakness, tremor, incoordination, spasms, paresthesia or pain Psychiatric: Denies confusion, memory loss or sensory loss. Denies Depression. Endocrine: Denies change in weight, skin, hair change, nocturia, and paresthesia, diabetic polys, visual blurring or hyper / hypo glycemic episodes.  Heme/Lymph: No excessive bleeding, bruising or enlarged lymph nodes.  Physical Exam  BP 122/80   Pulse 71   Temp 97.9 F (36.6 C)   Ht 5\' 9"  (1.753 m)   Wt 194 lb 6.4 oz (88.2 kg)   SpO2 96%   BMI 28.71 kg/m   General Appearance: Well nourished and well groomed and in no apparent distress.  Eyes: PERRLA, EOMs, conjunctiva no swelling or erythema, normal fundi and vessels. Sinuses: No frontal/maxillary tenderness ENT/Mouth: EACs patent / TMs  nl. Nares clear without erythema, swelling, mucoid exudates. Oral hygiene is good. No erythema, swelling, or exudate. Tongue normal, non-obstructing. Tonsils not swollen or erythematous. Hearing normal.  Neck: Supple, thyroid not palpable. No bruits, nodes or JVD. Respiratory: Respiratory  effort normal.  BS equal and clear bilateral without rales, rhonci, wheezing or stridor. Cardio: Heart sounds are normal with regular rate and rhythm and no murmurs, rubs or gallops. Peripheral pulses are normal and equal bilaterally without edema. No aortic or femoral bruits. Chest: symmetric with normal excursions and percussion.  Abdomen: Soft, with Nl bowel sounds. Nontender, no guarding, rebound, hernias, masses, or organomegaly.  Lymphatics: Non tender without lymphadenopathy.  Genitourinary: No hernias.Testes nl. DRE - prostate nl for age - smooth & firm w/o nodules. Musculoskeletal: Full ROM all peripheral extremities, joint stability, 5/5 strength, and normal gait. Skin: Warm and dry without rashes, lesions, cyanosis, clubbing or  ecchymosis.  Neuro: Cranial nerves intact, reflexes equal bilaterally. Normal muscle tone, no cerebellar symptoms. Sensation intact.  Pysch: Alert and oriented X 3 with normal affect, insight and judgment appropriate.   Assessment and Plan  1. Annual Preventative/Screening Exam   2. Labile hypertension  - EKG 12-Lead - [place], RETROPERITNL ABD,  LTD - Urinalysis, Routine w reflex microscopic - Microalbumin / creatinine urine ratio - CBC with Differential/Platelet - COMPLETE METABOLIC PANEL WITH GFR - Magnesium - TSH  3. Hyperlipidemia, mixed  - EKG 12-Lead - [place], RETROPERITNL ABD,  LTD - Lipid panel - TSH  4. Abnormal glucose  -  EKG 12-Lead - [place], RETROPERITNL ABD,  LTD - Hemoglobin A1c - Insulin, random  5. Vitamin D deficiency  - VITAMIN D 25 Hydroxyl  6. Testosterone deficiency  - Testosterone  7. Gastroesophageal reflux disease  - CBC with Differential/Platelet  8. BPH with obstruction/lower urinary tract symptoms  - PSA  9. Prostate cancer screening  - PSA  10. Screening for colorectal cancer   11. Screening for ischemic heart disease  - EKG 12-Lead  12. Screening for AAA (aortic abdominal aneurysm)  - [place],  RETROPERITNL ABD,  LTD  13. Former smoker  - EKG 12-Lead - [place], RETROPERITNL ABD,  LTD  14. Medication management  - Urinalysis, Routine w reflex microscopic - Microalbumin / creatinine urine ratio - CBC with Differential/Platelet - COMPLETE METABOLIC PANEL WITH GFR - Magnesium - Lipid panel - TSH - Hemoglobin A1c - Insulin, random - VITAMIN D 25 Hydroxyl       Patient was counseled in prudent diet, weight control to achieve/maintain BMI less than 25, BP monitoring, regular exercise and medications as discussed.  Discussed med effects and SE's. Routine screening labs and tests as requested with regular follow-up as recommended. Over 40 minutes of exam, counseling, chart review and high complex critical decision making was performed

## 2018-05-13 LAB — CBC WITH DIFFERENTIAL/PLATELET
Absolute Monocytes: 657 cells/uL (ref 200–950)
BASOS ABS: 34 {cells}/uL (ref 0–200)
BASOS PCT: 0.5 %
Eosinophils Absolute: 255 cells/uL (ref 15–500)
Eosinophils Relative: 3.8 %
HCT: 46.5 % (ref 38.5–50.0)
Hemoglobin: 15.9 g/dL (ref 13.2–17.1)
Lymphs Abs: 1561 cells/uL (ref 850–3900)
MCH: 32.6 pg (ref 27.0–33.0)
MCHC: 34.2 g/dL (ref 32.0–36.0)
MCV: 95.5 fL (ref 80.0–100.0)
MPV: 10 fL (ref 7.5–12.5)
Monocytes Relative: 9.8 %
NEUTROS ABS: 4194 {cells}/uL (ref 1500–7800)
Neutrophils Relative %: 62.6 %
PLATELETS: 309 10*3/uL (ref 140–400)
RBC: 4.87 10*6/uL (ref 4.20–5.80)
RDW: 12.1 % (ref 11.0–15.0)
Total Lymphocyte: 23.3 %
WBC: 6.7 10*3/uL (ref 3.8–10.8)

## 2018-05-13 LAB — URINALYSIS, ROUTINE W REFLEX MICROSCOPIC
Bilirubin Urine: NEGATIVE
Glucose, UA: NEGATIVE
Hgb urine dipstick: NEGATIVE
Ketones, ur: NEGATIVE
Leukocytes,Ua: NEGATIVE
Nitrite: NEGATIVE
Protein, ur: NEGATIVE
Specific Gravity, Urine: 1.02 (ref 1.001–1.03)
pH: 7 (ref 5.0–8.0)

## 2018-05-13 LAB — COMPLETE METABOLIC PANEL WITH GFR
AG Ratio: 1.8 (calc) (ref 1.0–2.5)
ALT: 18 U/L (ref 9–46)
AST: 20 U/L (ref 10–35)
Albumin: 4.4 g/dL (ref 3.6–5.1)
Alkaline phosphatase (APISO): 47 U/L (ref 35–144)
BUN/Creatinine Ratio: 8 (calc) (ref 6–22)
BUN: 11 mg/dL (ref 7–25)
CHLORIDE: 104 mmol/L (ref 98–110)
CO2: 30 mmol/L (ref 20–32)
Calcium: 9.7 mg/dL (ref 8.6–10.3)
Creat: 1.37 mg/dL — ABNORMAL HIGH (ref 0.70–1.25)
GFR, Est African American: 62 mL/min/{1.73_m2} (ref >=60)
GFR, Est Non African American: 54 mL/min/{1.73_m2} — ABNORMAL LOW (ref >=60)
Globulin: 2.4 g/dL (calc) (ref 1.9–3.7)
Glucose, Bld: 84 mg/dL (ref 65–99)
Potassium: 4.9 mmol/L (ref 3.5–5.3)
Sodium: 144 mmol/L (ref 135–146)
Total Bilirubin: 0.9 mg/dL (ref 0.2–1.2)
Total Protein: 6.8 g/dL (ref 6.1–8.1)

## 2018-05-13 LAB — MICROALBUMIN / CREATININE URINE RATIO
CREATININE, URINE: 187 mg/dL (ref 20–320)
Microalb Creat Ratio: 4 mcg/mg creat (ref <30)
Microalb, Ur: 0.7 mg/dL

## 2018-05-13 LAB — TSH: TSH: 1.76 mIU/L (ref 0.40–4.50)

## 2018-05-13 LAB — HEMOGLOBIN A1C
EAG (MMOL/L): 5.5 (calc)
Hgb A1c MFr Bld: 5.1 % of total Hgb (ref <5.7)
Mean Plasma Glucose: 100 (calc)

## 2018-05-13 LAB — LIPID PANEL
Cholesterol: 139 mg/dL (ref <200)
HDL: 28 mg/dL — AB (ref >=40)
LDL Cholesterol (Calc): 88 mg/dL (calc)
Non-HDL Cholesterol (Calc): 111 mg/dL (calc) (ref <130)
Total CHOL/HDL Ratio: 5 (calc) — ABNORMAL HIGH (ref <5.0)
Triglycerides: 131 mg/dL (ref <150)

## 2018-05-13 LAB — VITAMIN D 25 HYDROXY (VIT D DEFICIENCY, FRACTURES): Vit D, 25-Hydroxy: 63 ng/mL (ref 30–100)

## 2018-05-13 LAB — PSA: PSA: 0.5 ng/mL (ref <=4.0)

## 2018-05-13 LAB — MAGNESIUM: Magnesium: 1.8 mg/dL (ref 1.5–2.5)

## 2018-05-13 LAB — TESTOSTERONE: Testosterone: 1224 ng/dL — ABNORMAL HIGH (ref 250–827)

## 2018-05-13 LAB — INSULIN, RANDOM: Insulin: 4 u[IU]/mL (ref 2.0–19.6)

## 2018-05-15 ENCOUNTER — Encounter: Payer: Self-pay | Admitting: Internal Medicine

## 2018-05-15 ENCOUNTER — Other Ambulatory Visit: Payer: Self-pay | Admitting: Internal Medicine

## 2018-05-15 DIAGNOSIS — E782 Mixed hyperlipidemia: Secondary | ICD-10-CM

## 2018-05-15 MED ORDER — PRAVASTATIN SODIUM 40 MG PO TABS: ORAL_TABLET | ORAL | 3 refills | Status: DC

## 2018-05-17 ENCOUNTER — Other Ambulatory Visit: Payer: Self-pay | Admitting: Internal Medicine

## 2018-05-17 DIAGNOSIS — E349 Endocrine disorder, unspecified: Secondary | ICD-10-CM

## 2018-05-17 MED ORDER — TESTOSTERONE CYPIONATE 200 MG/ML IM SOLN: INTRAMUSCULAR | 2 refills | Status: DC

## 2018-06-27 ENCOUNTER — Other Ambulatory Visit: Payer: Self-pay | Admitting: Physician Assistant

## 2018-06-27 DIAGNOSIS — G8921 Chronic pain due to trauma: Secondary | ICD-10-CM

## 2018-08-10 NOTE — Progress Notes (Deleted)
MEDICARE ANNUAL WELLNESS VISIT AND FOLLOW UP Assessment:   Diagnoses and all orders for this visit:  Welcome to Medicare preventive visit  Labile hypertension Continue medications Monitor blood pressure at home; call if consistently over 130/80 Continue DASH diet.   Reminder to go to the ER if any CP, SOB, nausea, dizziness, severe HA, changes vision/speech, left arm numbness and tingling and jaw pain.  Hyperlipidemia, mixed Continue medications: pravastatin Continue low cholesterol diet and exercise.  Check lipid panel.   Abnormal glucose Recent A1Cs at goal Discussed diet/exercise, weight management  Defer A1C; check CMP  Vitamin D deficiency At goal at recent check; continue to recommend supplementation for goal of 60-100 Defer vitamin D level  Testosterone deficiency continue to monitor, states medication is helping with symptoms of low T.   Chronic pain due to trauma Lower back pain; well managed by chiropractor visits and regular exercise  BMI 25.0-25.9,adult Continue to recommend diet heavy in fruits and veggies and low in animal meats, cheeses, and dairy products, appropriate calorie intake Discuss exercise recommendations routinely Continue to monitor weight at each visit   Over 30 minutes of exam, counseling, chart review, and critical decision making was performed  Future Appointments  Date Time Provider [place]  08/11/2018 11:15 AM [name], NP [facility] None  10/14/2018 10:45 AM [name], NP [facility] None  11/11/2018 11:00 AM [name], MD [facility] None  05/18/2019 10:00 AM [name], MD [facility] None  06/08/2019 10:00 AM [name], MD [facility] None     Plan:   During the course of the visit the patient was educated and counseled about appropriate screening and preventive services including:    Pneumococcal vaccine   Influenza vaccine  Prevnar 13  Td vaccine  Screening  electrocardiogram  Colorectal cancer screening  Diabetes screening  Glaucoma screening  Nutrition counseling    Subjective:  Christian Dixon is a 68 y.o. male presents for follow up on htn, hyperlipidemia, glucose, weight, vitamin D deficiency, and for annual medicare wellness visit.   He has chronic back pain related to history of trauma, reports well managed by chiropractic visits and regular exercise.   BMI is There is no height or weight on file to calculate BMI., he has been working on diet and exercise. Wt Readings from Last 3 Encounters:  05/12/18 194 lb 6.4 oz (88.2 kg)  12/07/17 187 lb (84.8 kg)  10/05/17 182 lb (82.6 kg)    His blood pressure {HAS HAS NOT:18834} been controlled at home, today their BP is    He {DOES_DOES CBJ:62831} workout. He denies chest pain, shortness of breath, dizziness.   He is on cholesterol medication (pravastatin ***, omega 3) and denies myalgias. His cholesterol is at goal. The cholesterol last visit was:   Lab Results  Component Value Date   CHOL 139 05/12/2018   HDL 28 (L) 05/12/2018   LDLCALC 88 05/12/2018   TRIG 131 05/12/2018   CHOLHDL 5.0 (H) 05/12/2018    He {Has/has not:18111} been working on diet and exercise for glucose management, and denies {Symptoms; diabetes w/o none:19199}. Last A1C in the office was:  Lab Results  Component Value Date   HGBA1C 5.1 05/12/2018   Patient is on Vitamin D supplement.   Lab Results  Component Value Date   VD25OH 16 05/12/2018     He has a history of testosterone deficiency and is on testosterone replacement. He states that the testosterone helps with his energy, libido, muscle mass. Lab Results  Component Value Date   TESTOSTERONE 1,224 (H) 05/12/2018      Medication Review:  Current Outpatient Medications (Endocrine & Metabolic):  .  testosterone cypionate (DEPOTESTOSTERONE CYPIONATE) 200 MG/ML injection, INJECT 2 MLS INTRAMUSCULARLY EVERY 2 WEEK  Current Outpatient  Medications (Cardiovascular):  .  pravastatin (PRAVACHOL) 40 MG tablet, Take 1 tablet at Bedtime for Cholesterol .  terazosin (HYTRIN) 10 MG capsule, Take 1 capsule at bedtime for prostate.  Current Outpatient Medications (Respiratory):  .  fluticasone (FLONASE) 50 MCG/ACT nasal spray, Place into both nostrils as needed for allergies or rhinitis.  Current Outpatient Medications (Analgesics):  .  meloxicam (MOBIC) 15 MG tablet, TAKE 1/2 TO 1 (ONE-HALF TO ONE) TABLET BY MOUTH ONCE DAILY WITH FOOD FOR PAIN AND FOR INFLAMMATION   Current Outpatient Medications (Other):  .  cholecalciferol (VITAMIN D) 1000 units tablet, Take 1,000 Units by mouth daily. .  cimetidine (TAGAMET) 400 MG tablet, Take 1 tablet 2 x  /day for acid indigestion & Reflux .  Flaxseed, Linseed, (FLAXSEED OIL PO), Take 1,400 mg by mouth 2 (two) times daily. .  multivitamin (ONE-A-DAY MEN'S) TABS tablet, Take 1 tablet by mouth daily. .  Omega-3 Fatty Acids (FISH OIL) 1200 MG CAPS, Take by mouth 3 (three) times daily. Marland Kitchen  OVER THE COUNTER MEDICATION, Takes allergy tablets PRN  Allergies: No Known Allergies  Current Problems (verified) has Labile hypertension; Hyperlipidemia, mixed; Vitamin D deficiency; Testosterone deficiency; Chronic pain due to trauma; BMI 25.0-25.9,adult; and Left anterior fascicular block (LAFB) on their problem list.  Screening Tests  There is no immunization history on file for this patient.  Preventative care: Last colonoscopy: 09/2017 neg  Prior vaccinations: TD or Tdap: has had in last 10 years, will request records *** Influenza: declines  Pneumococcal: 1 year after prevnar Prevnar13: DUE *** Shingles/Zostavax: declines   Names of Other Physician/Practitioners you currently use: 1. [facility] here for primary care 2. - , eye doctor, last visit remote, has 66 year old glasses  3. - , dentist, last visit remote, full dentures  Patient Care  Team: [name], MD as PCP - General (Internal Medicine)  Surgical: He  has no past surgical history on file. Family His family history is not on file. Social history  He reports that he has quit smoking. He has never used smokeless tobacco. He reports that he does not drink alcohol. No history on file for drug.  MEDICARE WELLNESS OBJECTIVES: Physical activity:   Cardiac risk factors:   Depression/mood screen:   Depression screen [facility] 2/9 05/12/2018  Decreased Interest 0  Down, Depressed, Hopeless 0  PHQ - 2 Score 0    ADLs:  In your present state of health, do you have any difficulty performing the following activities: 05/12/2018 10/05/2017  Hearing? N N  Vision? N N  Difficulty concentrating or making decisions? N N  Walking or climbing stairs? N N  Dressing or bathing? N N  Doing errands, shopping? N N  Some recent data might be hidden     Cognitive Testing  Alert? Yes  Normal Appearance?Yes  Oriented to person? Yes  Place? Yes   Time? Yes  Recall of three objects?  Yes  Can perform simple calculations? Yes  Displays appropriate judgment?Yes  Can read the correct time from a watch face?Yes  EOL planning:     Objective:   There were no vitals filed for this visit. There is no height or weight on file to calculate BMI.  General appearance: alert, no distress, WD/WN, male HEENT: normocephalic, sclerae anicteric, TMs pearly, nares patent, no discharge or erythema, pharynx normal Oral cavity: MMM, no lesions Neck: supple, no lymphadenopathy, no thyromegaly, no masses Heart: RRR, normal S1, S2, no murmurs Lungs: CTA bilaterally, no wheezes, rhonchi, or rales Abdomen: +bs, soft, non tender, non distended, no masses, no hepatomegaly, no splenomegaly Musculoskeletal: nontender, no swelling, no obvious deformity Extremities: no edema, no cyanosis, no clubbing Pulses: 2+ symmetric, upper and lower extremities, normal cap refill Neurological: alert, oriented x 3,  CN2-12 intact, strength normal upper extremities and lower extremities, sensation normal throughout, DTRs 2+ throughout, no cerebellar signs, gait normal Psychiatric: normal affect, behavior normal, pleasant   Medicare Attestation I have personally reviewed: The patient's medical and social history Their use of alcohol, tobacco or illicit drugs Their current medications and supplements The patient's functional ability including ADLs,fall risks, home safety risks, cognitive, and hearing and visual impairment Diet and physical activities Evidence for depression or mood disorders  The patient's weight, height, BMI, and visual acuity have been recorded in the chart.  I have made referrals, counseling, and provided education to the patient based on review of the above and I have provided the patient with a written personalized care plan for preventive services.     Izora Ribas, NP   08/10/2018

## 2018-08-11 ENCOUNTER — Ambulatory Visit: Payer: Self-pay | Admitting: Adult Health

## 2018-08-11 NOTE — Progress Notes (Deleted)
MEDICARE ANNUAL WELLNESS VISIT AND FOLLOW UP Assessment:   Diagnoses and all orders for this visit:  Welcome to Medicare preventive visit  Labile hypertension Continue medications Monitor blood pressure at home; call if consistently over 130/80 Continue DASH diet.   Reminder to go to the ER if any CP, SOB, nausea, dizziness, severe HA, changes vision/speech, left arm numbness and tingling and jaw pain.  Hyperlipidemia, mixed Continue medications: pravastatin Continue low cholesterol diet and exercise.  Check lipid panel.   Abnormal glucose Recent A1Cs at goal Discussed diet/exercise, weight management  Defer A1C; check CMP  Vitamin D deficiency At goal at recent check; continue to recommend supplementation for goal of 60-100 Defer vitamin D level  Testosterone deficiency continue to monitor, states medication is helping with symptoms of low T.   Chronic pain due to trauma Lower back pain; well managed by chiropractor visits and regular exercise  BMI 25.0-25.9,adult Continue to recommend diet heavy in fruits and veggies and low in animal meats, cheeses, and dairy products, appropriate calorie intake Discuss exercise recommendations routinely Continue to monitor weight at each visit   Over 30 minutes of exam, counseling, chart review, and critical decision making was performed  Future Appointments  Date Time Provider [place]  08/12/2018 11:30 AM [name], NP [facility] None  10/14/2018 10:45 AM [name], NP [facility] None  11/11/2018 11:00 AM [name], MD [facility] None  05/18/2019 10:00 AM [name], MD [facility] None  06/08/2019 10:00 AM [name], MD [facility] None     Plan:   During the course of the visit the patient was educated and counseled about appropriate screening and preventive services including:    Pneumococcal vaccine   Influenza vaccine  Prevnar 13  Td vaccine  Screening  electrocardiogram  Colorectal cancer screening  Diabetes screening  Glaucoma screening  Nutrition counseling    Subjective:  Christian Dixon is a 68 y.o. male presents for follow up on htn, hyperlipidemia, glucose, weight, vitamin D deficiency, and for annual medicare wellness visit.   He has chronic back pain related to history of trauma, reports well managed by chiropractic visits and regular exercise.   BMI is There is no height or weight on file to calculate BMI., he has been working on diet and exercise. Wt Readings from Last 3 Encounters:  05/12/18 194 lb 6.4 oz (88.2 kg)  12/07/17 187 lb (84.8 kg)  10/05/17 182 lb (82.6 kg)    His blood pressure {HAS HAS NOT:18834} been controlled at home, today their BP is    He {DOES_DOES DJS:97026} workout. He denies chest pain, shortness of breath, dizziness.   He is on cholesterol medication (pravastatin ***, omega 3) and denies myalgias. His cholesterol is at goal. The cholesterol last visit was:   Lab Results  Component Value Date   CHOL 139 05/12/2018   HDL 28 (L) 05/12/2018   LDLCALC 88 05/12/2018   TRIG 131 05/12/2018   CHOLHDL 5.0 (H) 05/12/2018    He {Has/has not:18111} been working on diet and exercise for glucose management, and denies {Symptoms; diabetes w/o none:19199}. Last A1C in the office was:  Lab Results  Component Value Date   HGBA1C 5.1 05/12/2018   Patient is on Vitamin D supplement.   Lab Results  Component Value Date   VD25OH 75 05/12/2018     He has a history of testosterone deficiency and is on testosterone replacement. He states that the testosterone helps with his energy, libido, muscle mass. Lab Results  Component Value Date   TESTOSTERONE 1,224 (H) 05/12/2018      Medication Review:  Current Outpatient Medications (Endocrine & Metabolic):  .  testosterone cypionate (DEPOTESTOSTERONE CYPIONATE) 200 MG/ML injection, INJECT 2 MLS INTRAMUSCULARLY EVERY 2 WEEK  Current Outpatient  Medications (Cardiovascular):  .  pravastatin (PRAVACHOL) 40 MG tablet, Take 1 tablet at Bedtime for Cholesterol .  terazosin (HYTRIN) 10 MG capsule, Take 1 capsule at bedtime for prostate.  Current Outpatient Medications (Respiratory):  .  fluticasone (FLONASE) 50 MCG/ACT nasal spray, Place into both nostrils as needed for allergies or rhinitis.  Current Outpatient Medications (Analgesics):  .  meloxicam (MOBIC) 15 MG tablet, TAKE 1/2 TO 1 (ONE-HALF TO ONE) TABLET BY MOUTH ONCE DAILY WITH FOOD FOR PAIN AND FOR INFLAMMATION   Current Outpatient Medications (Other):  .  cholecalciferol (VITAMIN D) 1000 units tablet, Take 1,000 Units by mouth daily. .  cimetidine (TAGAMET) 400 MG tablet, Take 1 tablet 2 x  /day for acid indigestion & Reflux .  Flaxseed, Linseed, (FLAXSEED OIL PO), Take 1,400 mg by mouth 2 (two) times daily. .  multivitamin (ONE-A-DAY MEN'S) TABS tablet, Take 1 tablet by mouth daily. .  Omega-3 Fatty Acids (FISH OIL) 1200 MG CAPS, Take by mouth 3 (three) times daily. Marland Kitchen  OVER THE COUNTER MEDICATION, Takes allergy tablets PRN  Allergies: No Known Allergies  Current Problems (verified) has Labile hypertension; Hyperlipidemia, mixed; Vitamin D deficiency; Testosterone deficiency; Chronic pain due to trauma; BMI 25.0-25.9,adult; and Left anterior fascicular block (LAFB) on their problem list.  Screening Tests  There is no immunization history on file for this patient.  Preventative care: Last colonoscopy: 09/2017 neg  Prior vaccinations: TD or Tdap: has had in last 10 years, will request records *** Influenza: declines  Pneumococcal: 1 year after prevnar Prevnar13: DUE *** Shingles/Zostavax: declines   Names of Other Physician/Practitioners you currently use: 1. [facility] here for primary care 2. - , eye doctor, last visit remote, has 66 year old glasses  3. - , dentist, last visit remote, full dentures  Patient Care  Team: [name], MD as PCP - General (Internal Medicine)  Surgical: He  has no past surgical history on file. Family His family history is not on file. Social history  He reports that he has quit smoking. He has never used smokeless tobacco. He reports that he does not drink alcohol. No history on file for drug.  MEDICARE WELLNESS OBJECTIVES: Physical activity:   Cardiac risk factors:   Depression/mood screen:   Depression screen [facility] 2/9 05/12/2018  Decreased Interest 0  Down, Depressed, Hopeless 0  PHQ - 2 Score 0    ADLs:  In your present state of health, do you have any difficulty performing the following activities: 05/12/2018 10/05/2017  Hearing? N N  Vision? N N  Difficulty concentrating or making decisions? N N  Walking or climbing stairs? N N  Dressing or bathing? N N  Doing errands, shopping? N N  Some recent data might be hidden     Cognitive Testing  Alert? Yes  Normal Appearance?Yes  Oriented to person? Yes  Place? Yes   Time? Yes  Recall of three objects?  Yes  Can perform simple calculations? Yes  Displays appropriate judgment?Yes  Can read the correct time from a watch face?Yes  EOL planning:     Objective:   There were no vitals filed for this visit. There is no height or weight on file to calculate BMI.  General appearance: alert, no distress, WD/WN, male HEENT: normocephalic, sclerae anicteric, TMs pearly, nares patent, no discharge or erythema, pharynx normal Oral cavity: MMM, no lesions Neck: supple, no lymphadenopathy, no thyromegaly, no masses Heart: RRR, normal S1, S2, no murmurs Lungs: CTA bilaterally, no wheezes, rhonchi, or rales Abdomen: +bs, soft, non tender, non distended, no masses, no hepatomegaly, no splenomegaly Musculoskeletal: nontender, no swelling, no obvious deformity Extremities: no edema, no cyanosis, no clubbing Pulses: 2+ symmetric, upper and lower extremities, normal cap refill Neurological: alert, oriented x 3,  CN2-12 intact, strength normal upper extremities and lower extremities, sensation normal throughout, DTRs 2+ throughout, no cerebellar signs, gait normal Psychiatric: normal affect, behavior normal, pleasant   Medicare Attestation I have personally reviewed: The patient's medical and social history Their use of alcohol, tobacco or illicit drugs Their current medications and supplements The patient's functional ability including ADLs,fall risks, home safety risks, cognitive, and hearing and visual impairment Diet and physical activities Evidence for depression or mood disorders  The patient's weight, height, BMI, and visual acuity have been recorded in the chart.  I have made referrals, counseling, and provided education to the patient based on review of the above and I have provided the patient with a written personalized care plan for preventive services.     Izora Ribas, NP   08/11/2018

## 2018-08-12 ENCOUNTER — Ambulatory Visit: Payer: Self-pay | Admitting: Adult Health

## 2018-10-13 DIAGNOSIS — N183 Chronic kidney disease, stage 3 unspecified: Secondary | ICD-10-CM | POA: Insufficient documentation

## 2018-10-13 DIAGNOSIS — K219 Gastro-esophageal reflux disease without esophagitis: Secondary | ICD-10-CM | POA: Insufficient documentation

## 2018-10-13 NOTE — Progress Notes (Deleted)
MEDICARE ANNUAL WELLNESS VISIT AND FOLLOW UP Assessment:    Medicare preventive visit  Labile hypertension Continue medications Monitor blood pressure at home; call if consistently over 130/80 Continue DASH diet.   Reminder to go to the ER if any CP, SOB, nausea, dizziness, severe HA, changes vision/speech, left arm numbness and tingling and jaw pain.  Left anterior fascicular block Monitor; EKG at CPE, no rate controlling drugs; refer to cardiology if progresses  Hyperlipidemia, mixed Continue medications: pravastatin Continue low cholesterol diet and exercise.  Check lipid panel.   Vitamin D deficiency Continue supplementation for goal of 60-100 At goal last check; defer vitamin D level  Testosterone deficiency continue to monitor, states medication is helping with symptoms of low T.   Acid reflux Well managed on current medications Discussed diet, avoiding triggers and other lifestyle changes  Chronic pain due to trauma Lower back pain; well managed by chiropractor visits and regular exercise  BMI 25.0-25.9,adult Continue to recommend diet heavy in fruits and veggies and low in animal meats, cheeses, and dairy products, appropriate calorie intake Discuss exercise recommendations routinely Continue to monitor weight at each visit  CKD III ([place]) Increase fluids, avoid NSAIDS, monitor sugars, will monitor   Over 30 minutes of exam, counseling, chart review, and critical decision making was performed  Future Appointments  Date Time Provider [place]  10/14/2018 10:45 AM Christian Comber, NP [facility] None  11/11/2018 11:00 AM [name], MD [facility] None  05/18/2019 10:00 AM [name], MD [facility] None  06/08/2019 10:00 AM [name], MD [facility] None     Plan:   During the course of the visit the patient was educated and counseled about appropriate screening and preventive services including:    Pneumococcal vaccine    Influenza vaccine  Prevnar 13  Td vaccine  Screening electrocardiogram  Colorectal cancer screening  Diabetes screening  Glaucoma screening  Nutrition counseling    Subjective:  Christian Dixon is a 68 y.o. male newly established to our office after moving from [place] presents for [employer] Wellness visit and follow up for chronic medication conditions.   he has a diagnosis of reflux which is currently managed by tagamet he reports symptoms {ACTION; ARE/ARE [id number]} currently well controlled, and denies breakthrough reflux, burning in chest, hoarseness or cough.    BMI is There is no height or weight on file to calculate BMI., he has been working on diet and exercise. Wt Readings from Last 3 Encounters:  05/12/18 194 lb 6.4 oz (88.2 kg)  12/07/17 187 lb (84.8 kg)  10/05/17 182 lb (82.6 kg)   His blood pressure has been controlled at home, today their BP is    He does workout. He denies chest pain, shortness of breath, dizziness.   He is on cholesterol medication (pravastatin 40 mg daily) and denies myalgias. His cholesterol is at goal. The cholesterol last visit was:   Lab Results  Component Value Date   CHOL 139 05/12/2018   HDL 28 (L) 05/12/2018   LDLCALC 88 05/12/2018   TRIG 131 05/12/2018   CHOLHDL 5.0 (H) 05/12/2018    He {Has/has not:18111} been working on diet and exercise for glucose management, and denies {Symptoms; diabetes w/o none:19199}. Last A1C in the office was:  Lab Results  Component Value Date   HGBA1C 5.1 05/12/2018   Lab Results  Component Value Date   GFRNONAA 25 (L) 05/12/2018   Patient is on Vitamin D supplement.   Lab Results  Component Value Date  VD25OH 63 05/12/2018     He has a history of testosterone deficiency and is on testosterone replacement. He states that the testosterone helps with his energy, libido, muscle mass. Lab Results  Component Value Date   TESTOSTERONE 1,224 (H) 05/12/2018   He is on hytrin  for BP and LUTS; *** Lab Results  Component Value Date   PSA 0.5 05/12/2018      Medication Review:  Current Outpatient Medications (Endocrine & Metabolic):  .  testosterone cypionate (DEPOTESTOSTERONE CYPIONATE) 200 MG/ML injection, INJECT 2 MLS INTRAMUSCULARLY EVERY 2 WEEK  Current Outpatient Medications (Cardiovascular):  .  pravastatin (PRAVACHOL) 40 MG tablet, Take 1 tablet at Bedtime for Cholesterol .  terazosin (HYTRIN) 10 MG capsule, Take 1 capsule at bedtime for prostate.  Current Outpatient Medications (Respiratory):  .  fluticasone (FLONASE) 50 MCG/ACT nasal spray, Place into both nostrils as needed for allergies or rhinitis.  Current Outpatient Medications (Analgesics):  .  meloxicam (MOBIC) 15 MG tablet, TAKE 1/2 TO 1 (ONE-HALF TO ONE) TABLET BY MOUTH ONCE DAILY WITH FOOD FOR PAIN AND FOR INFLAMMATION   Current Outpatient Medications (Other):  .  cholecalciferol (VITAMIN D) 1000 units tablet, Take 1,000 Units by mouth daily. .  cimetidine (TAGAMET) 400 MG tablet, Take 1 tablet 2 x  /day for acid indigestion & Reflux .  Flaxseed, Linseed, (FLAXSEED OIL PO), Take 1,400 mg by mouth 2 (two) times daily. .  multivitamin (ONE-A-DAY MEN'S) TABS tablet, Take 1 tablet by mouth daily. .  Omega-3 Fatty Acids (FISH OIL) 1200 MG CAPS, Take by mouth 3 (three) times daily. Marland Kitchen  OVER THE COUNTER MEDICATION, Takes allergy tablets PRN  Allergies: No Known Allergies  Current Problems (verified) has Labile hypertension; Hyperlipidemia, mixed; Vitamin D deficiency; Testosterone deficiency; Chronic pain due to trauma; BMI 25.0-25.9,adult; Left anterior fascicular block (LAFB); and Acid reflux on their problem list.  Screening Tests  There is no immunization history on file for this patient.  Preventative care: Last colonoscopy: never, Last cologuard: 09/2017 neg  Prior vaccinations: TD or Tdap: has had in last 10 years, will request records  Influenza: declines  Pneumococcal: 1  year after prevnar Prevnar13: DUE Shingles/Zostavax: declines   Names of Other Physician/Practitioners you currently use: 1. [facility] here for primary care 2. - , eye doctor, last visit remote, has 66 year old glasses  3. - , dentist, last visit remote, full dentures  Patient Care Team: [name], MD as PCP - General (Internal Medicine)  Surgical: He  has no past surgical history on file. Family His family history is not on file. Social history  He reports that he has quit smoking. He has never used smokeless tobacco. He reports that he does not drink alcohol. No history on file for drug.  MEDICARE WELLNESS OBJECTIVES: Physical activity:   Cardiac risk factors:   Depression/mood screen:   Depression screen [facility] 2/9 05/12/2018  Decreased Interest 0  Down, Depressed, Hopeless 0  PHQ - 2 Score 0    ADLs:  In your present state of health, do you have any difficulty performing the following activities: 05/12/2018  Hearing? N  Vision? N  Difficulty concentrating or making decisions? N  Walking or climbing stairs? N  Dressing or bathing? N  Doing errands, shopping? N  Some recent data might be hidden     Cognitive Testing  Alert? Yes  Normal Appearance?Yes  Oriented to person? Yes  Place? Yes   Time? Yes  Recall of  three objects?  Yes  Can perform simple calculations? Yes  Displays appropriate judgment?Yes  Can read the correct time from a watch face?Yes  EOL planning:     Objective:   There were no vitals filed for this visit. There is no height or weight on file to calculate BMI.  General appearance: alert, no distress, WD/WN, male HEENT: normocephalic, sclerae anicteric, TMs pearly, nares patent, no discharge or erythema, pharynx normal Oral cavity: MMM, no lesions Neck: supple, no lymphadenopathy, no thyromegaly, no masses Heart: RRR, normal S1, S2, no murmurs Lungs: CTA bilaterally, no wheezes, rhonchi, or  rales Abdomen: +bs, soft, non tender, non distended, no masses, no hepatomegaly, no splenomegaly Musculoskeletal: nontender, no swelling, no obvious deformity Extremities: no edema, no cyanosis, no clubbing Pulses: 2+ symmetric, upper and lower extremities, normal cap refill Neurological: alert, oriented x 3, CN2-12 intact, strength normal upper extremities and lower extremities, sensation normal throughout, DTRs 2+ throughout, no cerebellar signs, gait normal Psychiatric: normal affect, behavior normal, pleasant   Medicare Attestation I have personally reviewed: The patient's medical and social history Their use of alcohol, tobacco or illicit drugs Their current medications and supplements The patient's functional ability including ADLs,fall risks, home safety risks, cognitive, and hearing and visual impairment Diet and physical activities Evidence for depression or mood disorders  The patient's weight, height, BMI, and visual acuity have been recorded in the chart.  I have made referrals, counseling, and provided education to the patient based on review of the above and I have provided the patient with a written personalized care plan for preventive services.     Izora Ribas, NP   10/13/2018

## 2018-10-14 ENCOUNTER — Ambulatory Visit: Payer: Self-pay | Admitting: Adult Health

## 2018-10-19 NOTE — Progress Notes (Signed)
MEDICARE ANNUAL WELLNESS VISIT AND FOLLOW UP Assessment:    Medicare preventive visit  Labile hypertension Continue medications Monitor blood pressure at home; call if consistently over 130/80 Continue DASH diet.   Reminder to go to the ER if any CP, SOB, nausea, dizziness, severe HA, changes vision/speech, left arm numbness and tingling and jaw pain.  Left anterior fascicular block Monitor; EKG at CPE, no rate controlling drugs; refer to cardiology if progresses  Hyperlipidemia, mixed Continue medications: pravastatin Continue low cholesterol diet and exercise.  Check lipid panel.   Vitamin D deficiency Continue supplementation for goal of 60-100 At goal last check; defer vitamin D level  Testosterone deficiency continue to monitor, states medication is helping with symptoms of low T.   Acid reflux Well managed on current medications; discussed taper off of omeprazole, famotidine sent in and taper plan provided Discussed diet, avoiding triggers and other lifestyle changes  Chronic pain due to trauma Lower back pain; well managed by chiropractor visits and regular exercise  BMI 25.0-25.9,adult Continue to recommend diet heavy in fruits and veggies and low in animal meats, cheeses, and dairy products, appropriate calorie intake Discuss exercise recommendations routinely Continue to monitor weight at each visit  CKD III ([place]) Increase fluids, avoid NSAIDS, monitor sugars, will monitor   Over 30 minutes of exam, counseling, chart review, and critical decision making was performed  Future Appointments  Date Time Provider [place]  11/11/2018 11:00 AM [name], MD [facility] None  05/18/2019 10:00 AM [name], MD [facility] None  06/08/2019 10:00 AM [name], MD [facility] None     Plan:   During the course of the visit the patient was educated and counseled about appropriate screening and preventive services including:     Pneumococcal vaccine   Influenza vaccine  Prevnar 13  Td vaccine  Screening electrocardiogram  Colorectal cancer screening  Diabetes screening  Glaucoma screening  Nutrition counseling    Subjective:  Christian Dixon is a 68 y.o. male newly established to our office after moving from [place] presents for [employer] [facility]ness visit and follow up for chronic medication conditions.   he has a diagnosis of reflux which is currently managed by omeprazole - started recently after ran out of tagamet he reports symptoms is currently well controlled, and denies breakthrough reflux, burning in chest, hoarseness or cough.    BMI is Body mass index is 27.17 kg/m., he has been working on diet and exercise. Wt Readings from Last 3 Encounters:  10/20/18 184 lb (83.5 kg)  05/12/18 194 lb 6.4 oz (88.2 kg)  12/07/17 187 lb (84.8 kg)   His blood pressure has been controlled at home, today their BP is BP: 104/70  He does workout. He denies chest pain, shortness of breath, dizziness.   He is on cholesterol medication (pravastatin 40 mg daily) and denies myalgias. His cholesterol is at goal. The cholesterol last visit was:   Lab Results  Component Value Date   CHOL 139 05/12/2018   HDL 28 (L) 05/12/2018   LDLCALC 88 05/12/2018   TRIG 131 05/12/2018   CHOLHDL 5.0 (H) 05/12/2018    He has been working on diet and exercise for glucose management, and denies nausea, paresthesia of the feet, polydipsia, polyuria, visual disturbances and vomiting. Last A1C in the office was:  Lab Results  Component Value Date   HGBA1C 5.1 05/12/2018   Lab Results  Component Value Date   GFRNONAA 21 (L) 05/12/2018   Patient is on Vitamin  D supplement.   Lab Results  Component Value Date   VD25OH 63 05/12/2018     He has a history of testosterone deficiency and is on testosterone replacement, taking 400 mg every 2 weeks. He states that the testosterone helps with his energy, libido, muscle  mass. Lab Results  Component Value Date   TESTOSTERONE 1,224 (H) 05/12/2018   He is on hytrin for BP and LUTS;  Lab Results  Component Value Date   PSA 0.5 05/12/2018      Medication Review:  Current Outpatient Medications (Endocrine & Metabolic):  .  testosterone cypionate (DEPOTESTOSTERONE CYPIONATE) 200 MG/ML injection, INJECT 2 MLS INTRAMUSCULARLY EVERY 2 WEEK  Current Outpatient Medications (Cardiovascular):  .  pravastatin (PRAVACHOL) 40 MG tablet, Take 1 tablet at Bedtime for Cholesterol .  terazosin (HYTRIN) 10 MG capsule, Take 1 capsule at bedtime for prostate.  Current Outpatient Medications (Respiratory):  .  fluticasone (FLONASE) 50 MCG/ACT nasal spray, Place into both nostrils as needed for allergies or rhinitis.  Current Outpatient Medications (Analgesics):  .  meloxicam (MOBIC) 15 MG tablet, TAKE 1/2 TO 1 (ONE-HALF TO ONE) TABLET BY MOUTH ONCE DAILY WITH FOOD FOR PAIN AND FOR INFLAMMATION   Current Outpatient Medications (Other):  .  cholecalciferol (VITAMIN D) 1000 units tablet, Take 1,000 Units by mouth daily. .  Flaxseed, Linseed, (FLAXSEED OIL PO), Take 1,400 mg by mouth 2 (two) times daily. .  multivitamin (ONE-A-DAY MEN'S) TABS tablet, Take 1 tablet by mouth daily. .  Omega-3 Fatty Acids (FISH OIL) 1200 MG CAPS, Take by mouth 3 (three) times daily. Marland Kitchen  OVER THE COUNTER MEDICATION, Takes allergy tablets PRN .  OVER THE COUNTER MEDICATION, Omeprazole, 1 tablet at night .  cimetidine (TAGAMET) 400 MG tablet, Take 1 tablet 2 x  /day for acid indigestion & Reflux  Allergies: No Known Allergies  Current Problems (verified) has Labile hypertension; Hyperlipidemia, mixed; Vitamin D deficiency; Testosterone deficiency; Chronic pain due to trauma; BMI 25.0-25.9,adult; Left anterior fascicular block (LAFB); Acid reflux; and CKD (chronic kidney disease) stage 3, GFR 30-59 ml/min (HCC) on their problem list.  Screening Tests  There is no immunization history on  file for this patient.  Preventative care: Last colonoscopy: never, Last cologuard: 09/2017 neg  Prior vaccinations: TD or Tdap: has had in last 10 years, declines Influenza: declines  Pneumococcal: declines Prevnar13: declines Shingles/Zostavax: declines   Names of Other Physician/Practitioners you currently use: 1. [facility] here for primary care 2. - , eye doctor, last visit remote, has 66 year old glasses, patient will schedule to follow up 3. - , dentist, last visit remote, full dentures  Patient Care Team: [name], MD as PCP - General (Internal Medicine)  Surgical: He  has no past surgical history on file. Family His family history is not on file. Social history  He reports that he has quit smoking. He has never used smokeless tobacco. He reports that he does not drink alcohol. No history on file for drug.  MEDICARE WELLNESS OBJECTIVES: Physical activity:   Cardiac risk factors:   Depression/mood screen:   Depression screen [facility] 2/9 10/20/2018  Decreased Interest 0  Down, Depressed, Hopeless 0  PHQ - 2 Score 0    ADLs:  In your present state of health, do you have any difficulty performing the following activities: 05/12/2018  Hearing? N  Vision? N  Difficulty concentrating or making decisions? N  Walking or climbing stairs? N  Dressing or bathing? N  Doing errands, shopping? N  Some recent data might be hidden     Cognitive Testing  Alert? Yes  Normal Appearance?Yes  Oriented to person? Yes  Place? Yes   Time? Yes  Recall of three objects?  Yes  Can perform simple calculations? Yes  Displays appropriate judgment?Yes  Can read the correct time from a watch face?Yes  EOL planning: Does Patient Have a Medical Advance Directive?: No Would patient like information on creating a medical advance directive?: No - Patient declined   Objective:   Today's Vitals   10/20/18 1351  BP: 104/70  Pulse: 77  Temp:  (!) 97.3 F (36.3 C)  SpO2: 95%  Weight: 184 lb (83.5 kg)  Height: 5\' 9"  (1.753 m)   Body mass index is 27.17 kg/m.  General appearance: alert, no distress, WD/WN, male HEENT: normocephalic, sclerae anicteric, TMs pearly, nares patent, no discharge or erythema, pharynx normal Oral cavity: MMM, no lesions Neck: supple, no lymphadenopathy, no thyromegaly, no masses Heart: RRR, normal S1, S2, no murmurs Lungs: CTA bilaterally, no wheezes, rhonchi, or rales Abdomen: +bs, soft, non tender, non distended, no masses, no hepatomegaly, no splenomegaly Musculoskeletal: nontender, no swelling, no obvious deformity Extremities: no edema, no cyanosis, no clubbing Pulses: 2+ symmetric, upper and lower extremities, normal cap refill Neurological: alert, oriented x 3, CN2-12 intact, strength normal upper extremities and lower extremities, sensation normal throughout, DTRs 2+ throughout, no cerebellar signs, gait normal Psychiatric: normal affect, behavior normal, pleasant   Medicare Attestation I have personally reviewed: The patient's medical and social history Their use of alcohol, tobacco or illicit drugs Their current medications and supplements The patient's functional ability including ADLs,fall risks, home safety risks, cognitive, and hearing and visual impairment Diet and physical activities Evidence for depression or mood disorders  The patient's weight, height, BMI, and visual acuity have been recorded in the chart.  I have made referrals, counseling, and provided education to the patient based on review of the above and I have provided the patient with a written personalized care plan for preventive services.     Izora Ribas, NP   10/20/2018

## 2018-10-20 ENCOUNTER — Ambulatory Visit (INDEPENDENT_AMBULATORY_CARE_PROVIDER_SITE_OTHER): Payer: PPO | Admitting: Adult Health

## 2018-10-20 ENCOUNTER — Telehealth: Payer: Self-pay

## 2018-10-20 ENCOUNTER — Encounter: Payer: Self-pay | Admitting: Adult Health

## 2018-10-20 ENCOUNTER — Other Ambulatory Visit: Payer: Self-pay

## 2018-10-20 VITALS — BP 104/70 | HR 77 | Temp 97.3°F | Ht 69.0 in | Wt 184.0 lb

## 2018-10-20 DIAGNOSIS — E782 Mixed hyperlipidemia: Secondary | ICD-10-CM | POA: Diagnosis not present

## 2018-10-20 DIAGNOSIS — N183 Chronic kidney disease, stage 3 unspecified: Secondary | ICD-10-CM

## 2018-10-20 DIAGNOSIS — I444 Left anterior fascicular block: Secondary | ICD-10-CM

## 2018-10-20 DIAGNOSIS — K219 Gastro-esophageal reflux disease without esophagitis: Secondary | ICD-10-CM

## 2018-10-20 DIAGNOSIS — E559 Vitamin D deficiency, unspecified: Secondary | ICD-10-CM

## 2018-10-20 DIAGNOSIS — R6889 Other general symptoms and signs: Secondary | ICD-10-CM

## 2018-10-20 DIAGNOSIS — Z6825 Body mass index (BMI) 25.0-25.9, adult: Secondary | ICD-10-CM

## 2018-10-20 DIAGNOSIS — Z79899 Other long term (current) drug therapy: Secondary | ICD-10-CM

## 2018-10-20 DIAGNOSIS — R0989 Other specified symptoms and signs involving the circulatory and respiratory systems: Secondary | ICD-10-CM | POA: Diagnosis not present

## 2018-10-20 DIAGNOSIS — E349 Endocrine disorder, unspecified: Secondary | ICD-10-CM

## 2018-10-20 DIAGNOSIS — Z0001 Encounter for general adult medical examination with abnormal findings: Secondary | ICD-10-CM

## 2018-10-20 DIAGNOSIS — G8921 Chronic pain due to trauma: Secondary | ICD-10-CM | POA: Diagnosis not present

## 2018-10-20 DIAGNOSIS — Z0000 Encounter for general adult medical examination without abnormal findings: Secondary | ICD-10-CM

## 2018-10-20 MED ORDER — "BD ECLIPSE SYRINGE 21G X 1"" 3 ML MISC": 1.0000 | 1 refills | Status: DC

## 2018-10-20 MED ORDER — FAMOTIDINE 20 MG PO TABS: 20.0000 mg | ORAL_TABLET | Freq: Two times a day (BID) | ORAL | 1 refills | Status: DC | PRN

## 2018-10-20 MED ORDER — TESTOSTERONE CYPIONATE 200 MG/ML IM SOLN: INTRAMUSCULAR | 2 refills | Status: DC

## 2018-10-20 NOTE — Patient Instructions (Addendum)
Christian Dixon , Thank you for taking time to come for your Medicare Wellness Visit. I appreciate your ongoing commitment to your health goals. Please review the following plan we discussed and let me know if I can assist you in the future.   These are the goals we discussed: Goals    . DIET - INCREASE WATER INTAKE     65+ fluid ounces of water or sugar free liquid daily       This is a list of the screening recommended for you and due dates:  Health Maintenance  Topic Date Due  .  Hepatitis C: One time screening is recommended by Center for Disease Control  (CDC) for  adults born from [birth year] through 1963.   10/20/2019*  . Tetanus Vaccine  10/19/2021*  . Flu Shot  10/30/2018  . Cologuard (Stool DNA test)  10/28/2020  . HIV Screening  Discontinued  . Pneumonia vaccines  Discontinued  *Topic was postponed. The date shown is not the original due date.     GETTING OFF OF PPI's    Nexium/protonix/prilosec/Omeprazole/Dexilant/Aciphex are called PPI's, they are great at healing your stomach but should only be taken for a short period of time.     Recent studies have shown that taken for a long time they  can increase the risk of osteoporosis (weakening of your bones), pneumonia, low magnesium, restless legs, Cdiff (infection that causes diarrhea), DEMENTIA and most recently kidney damage / disease / insufficiency.     Due to this information we want to try to stop the PPI but if you try to stop it abruptly this can cause rebound acid and worsening symptoms.   So this is how we want you to get off the PPI:  - Start taking the nexium/protonix/prilosec/PPI  every other day with pepcid/ famotadine 2 x a day for 2 weeks   - then decrease the PPI to every 3 days while taking the pepcid  twice a day the other  days for 2 Weeks  - then try stopping PPI completely  - then you can try the pepcid once at night or up to 2 x day as needed.  - you can continue on this once at night or stop all  together  - Avoid alcohol, spicy foods, NSAIDS (aleve, ibuprofen) at this time. See foods below.   +++++++++++++++++++++++++++++++++++++++++++  Food Choices for Gastroesophageal Reflux Disease  When you have gastroesophageal reflux disease (GERD), the foods you eat and your eating habits are very important. Choosing the right foods can help ease the discomfort of GERD. WHAT GENERAL GUIDELINES DO I NEED TO FOLLOW?  Choose fruits, vegetables, whole grains, low-fat dairy products, and low-fat meat, fish, and poultry.  Limit fats such as oils, salad dressings, butter, nuts, and avocado.  Keep a food diary to identify foods that cause symptoms.  Avoid foods that cause reflux. These may be different for different people.  Eat frequent small meals instead of three large meals each day.  Eat your meals slowly, in a relaxed setting.  Limit fried foods.  Cook foods using methods other than frying.  Avoid drinking alcohol.  Avoid drinking large amounts of liquids with your meals.  Avoid bending over or lying down until 2-3 hours after eating.   WHAT FOODS ARE NOT RECOMMENDED? The following are some foods and drinks that may worsen your symptoms:  Vegetables Tomatoes. Tomato juice. Tomato and spaghetti sauce. Chili peppers. Onion and garlic. Horseradish. Fruits Oranges, grapefruit, and lemon (fruit and  juice). Meats High-fat meats, fish, and poultry. This includes hot dogs, ribs, ham, sausage, salami, and bacon. Dairy Whole milk and chocolate milk. Sour cream. Cream. Butter. Ice cream. Cream cheese.  Beverages Coffee and tea, with or without caffeine. Carbonated beverages or energy drinks. Condiments Hot sauce. Barbecue sauce.  Sweets/Desserts Chocolate and cocoa. Donuts. Peppermint and spearmint. Fats and Oils High-fat foods, including [place] fries and potato chips. Other Vinegar. Strong spices, such as black pepper, white pepper, red pepper, cayenne, curry powder, cloves,  ginger, and chili powder.

## 2018-10-20 NOTE — Telephone Encounter (Signed)
Testosterone syringe/needle refill request.

## 2018-10-21 ENCOUNTER — Other Ambulatory Visit: Payer: Self-pay | Admitting: Internal Medicine

## 2018-10-21 ENCOUNTER — Other Ambulatory Visit: Payer: Self-pay | Admitting: Physician Assistant

## 2018-10-21 DIAGNOSIS — G8921 Chronic pain due to trauma: Secondary | ICD-10-CM

## 2018-10-21 LAB — CBC WITH DIFFERENTIAL/PLATELET
Absolute Monocytes: 651 cells/uL (ref 200–950)
Basophils Absolute: 30 cells/uL (ref 0–200)
Basophils Relative: 0.4 %
Eosinophils Absolute: 274 cells/uL (ref 15–500)
Eosinophils Relative: 3.7 %
HCT: 44.1 % (ref 38.5–50.0)
Hemoglobin: 15.4 g/dL (ref 13.2–17.1)
Lymphs Abs: 1554 cells/uL (ref 850–3900)
MCH: 32 pg (ref 27.0–33.0)
MCHC: 34.9 g/dL (ref 32.0–36.0)
MCV: 91.5 fL (ref 80.0–100.0)
MPV: 9.9 fL (ref 7.5–12.5)
Monocytes Relative: 8.8 %
Neutro Abs: 4891 cells/uL (ref 1500–7800)
Neutrophils Relative %: 66.1 %
Platelets: 261 10*3/uL (ref 140–400)
RBC: 4.82 10*6/uL (ref 4.20–5.80)
RDW: 13.1 % (ref 11.0–15.0)
Total Lymphocyte: 21 %
WBC: 7.4 10*3/uL (ref 3.8–10.8)

## 2018-10-21 LAB — COMPLETE METABOLIC PANEL WITH GFR
AG Ratio: 2.1 (calc) (ref 1.0–2.5)
ALT: 22 U/L (ref 9–46)
AST: 22 U/L (ref 10–35)
Albumin: 4.4 g/dL (ref 3.6–5.1)
Alkaline phosphatase (APISO): 38 U/L (ref 35–144)
BUN: 12 mg/dL (ref 7–25)
CO2: 29 mmol/L (ref 20–32)
Calcium: 9.5 mg/dL (ref 8.6–10.3)
Chloride: 105 mmol/L (ref 98–110)
Creat: 1.05 mg/dL (ref 0.70–1.25)
GFR, Est African American: 86 mL/min/{1.73_m2} (ref >=60)
GFR, Est Non African American: 74 mL/min/{1.73_m2} (ref >=60)
Globulin: 2.1 g/dL (calc) (ref 1.9–3.7)
Glucose, Bld: 90 mg/dL (ref 65–99)
Potassium: 4.5 mmol/L (ref 3.5–5.3)
Sodium: 139 mmol/L (ref 135–146)
Total Bilirubin: 1.5 mg/dL — ABNORMAL HIGH (ref 0.2–1.2)
Total Protein: 6.5 g/dL (ref 6.1–8.1)

## 2018-10-21 LAB — TSH: TSH: 2.07 mIU/L (ref 0.40–4.50)

## 2018-10-21 LAB — LIPID PANEL
Cholesterol: 139 mg/dL (ref <200)
HDL: 35 mg/dL — ABNORMAL LOW (ref >=40)
LDL Cholesterol (Calc): 78 mg/dL (calc)
Non-HDL Cholesterol (Calc): 104 mg/dL (calc) (ref <130)
Total CHOL/HDL Ratio: 4 (calc) (ref <5.0)
Triglycerides: 163 mg/dL — ABNORMAL HIGH (ref <150)

## 2018-10-21 MED ORDER — MELOXICAM 15 MG PO TABS: ORAL_TABLET | ORAL | 1 refills | Status: DC

## 2018-10-25 ENCOUNTER — Other Ambulatory Visit: Payer: Self-pay | Admitting: Physician Assistant

## 2018-10-25 DIAGNOSIS — G8921 Chronic pain due to trauma: Secondary | ICD-10-CM

## 2018-11-11 ENCOUNTER — Ambulatory Visit: Payer: Self-pay | Admitting: Internal Medicine

## 2019-01-23 ENCOUNTER — Encounter: Payer: Self-pay | Admitting: Internal Medicine

## 2019-01-23 NOTE — Progress Notes (Signed)
      R  E  S  C  H  E  D  U  L  E  D                                                                                                                                                                                                                                                                This very nice 68 y.o. MWM presents for 3 month follow up with HTN, HLD, Pre-Diabetes, Testosterone Deficiency and Vitamin D Deficiency.       Patient is treated for HTN since 2015 & BP has been controlled on Hytrin concomitantly treating LUTS.  Today's  .  Patient does have CKD3 attribute to his HTCVD. Patient has had no complaints of any cardiac type chest pain, palpitations, dyspnea / orthopnea / PND, dizziness, claudication, or dependent edema.      Hyperlipidemia is controlled with diet & meds. Patient denies myalgias or other med SE's. Last Lipids were at goal albeit elevated Trig's:   Lab Results  Component Value Date   CHOL 139 10/20/2018   HDL 35 (L) 10/20/2018   LDLCALC 78 10/20/2018   TRIG 163 (H) 10/20/2018   CHOLHDL 4.0 10/20/2018      Also, the patient is followed expectantly for glucose intolerance and has had no symptoms of reactive hypoglycemia, diabetic polys, paresthesias or visual blurring.  Last A1c was Normal & at goal:  Lab Results  Component Value Date   HGBA1C 5.1 05/12/2018       Patient is on parenteral testosterone replacement.      Further, the patient also has history of Vitamin D Deficiency and supplements vitamin D without any suspected side-effects. Last vitamin D was at goal: Lab Results  Component Value Date   VD25OH 63 05/12/2018

## 2019-01-24 ENCOUNTER — Ambulatory Visit: Payer: Self-pay | Admitting: Internal Medicine

## 2019-01-24 ENCOUNTER — Other Ambulatory Visit: Payer: Self-pay | Admitting: Internal Medicine

## 2019-01-24 DIAGNOSIS — E349 Endocrine disorder, unspecified: Secondary | ICD-10-CM

## 2019-01-24 MED ORDER — PANTOPRAZOLE SODIUM 40 MG PO TBEC: DELAYED_RELEASE_TABLET | ORAL | 0 refills | Status: DC

## 2019-01-24 MED ORDER — TESTOSTERONE CYPIONATE 200 MG/ML IM SOLN: INTRAMUSCULAR | 0 refills | Status: DC

## 2019-02-23 ENCOUNTER — Other Ambulatory Visit: Payer: Self-pay | Admitting: Internal Medicine

## 2019-02-23 DIAGNOSIS — N138 Other obstructive and reflux uropathy: Secondary | ICD-10-CM

## 2019-03-07 ENCOUNTER — Encounter: Payer: Self-pay | Admitting: Internal Medicine

## 2019-03-07 ENCOUNTER — Ambulatory Visit (INDEPENDENT_AMBULATORY_CARE_PROVIDER_SITE_OTHER): Payer: PPO | Admitting: Internal Medicine

## 2019-03-07 ENCOUNTER — Other Ambulatory Visit: Payer: Self-pay

## 2019-03-07 VITALS — BP 102/66 | HR 68 | Temp 97.2°F | Resp 16 | Ht 69.0 in | Wt 182.4 lb

## 2019-03-07 DIAGNOSIS — R7309 Other abnormal glucose: Secondary | ICD-10-CM

## 2019-03-07 DIAGNOSIS — I10 Essential (primary) hypertension: Secondary | ICD-10-CM | POA: Diagnosis not present

## 2019-03-07 DIAGNOSIS — E782 Mixed hyperlipidemia: Secondary | ICD-10-CM

## 2019-03-07 DIAGNOSIS — N183 Chronic kidney disease, stage 3 unspecified: Secondary | ICD-10-CM

## 2019-03-07 DIAGNOSIS — K219 Gastro-esophageal reflux disease without esophagitis: Secondary | ICD-10-CM | POA: Diagnosis not present

## 2019-03-07 DIAGNOSIS — E559 Vitamin D deficiency, unspecified: Secondary | ICD-10-CM

## 2019-03-07 DIAGNOSIS — Z79899 Other long term (current) drug therapy: Secondary | ICD-10-CM | POA: Diagnosis not present

## 2019-03-07 NOTE — Patient Instructions (Signed)

## 2019-03-07 NOTE — Progress Notes (Signed)
History of Present Illness:      This very nice 68 y.o. MWM presents for 6 month follow up with HTN, HLD, Pre-Diabetes, Testosterone Deficiency and Vitamin D Deficiency. Patient has GERD controlled on his meds.      Patient is treated for HTN since 2015 & BP has been controlled on Hytrin concomitantly treating LUTS.  Patient does have CKD3 attribute to his HTCVD.   BP has been controlled at home. Today's BP is at goal - 102/66. Patient has had no complaints of any cardiac type chest pain, palpitations, dyspnea / orthopnea / PND, dizziness, claudication, or dependent edema.      Hyperlipidemia is controlled with diet & meds. Patient denies myalgias or other med SE's. Last Lipids were at goal albeit elevated Trig's:  Lab Results  Component Value Date   CHOL 139 10/20/2018   HDL 35 (L) 10/20/2018   LDLCALC 78 10/20/2018   TRIG 163 (H) 10/20/2018   CHOLHDL 4.0 10/20/2018        Also, the patient is followed expectantly for glucose intolerance and has had no symptoms of reactive hypoglycemia, diabetic polys, paresthesias or visual blurring.  Last A1c was Normal & at goal:  Lab Results  Component Value Date   HGBA1C 5.1 05/12/2018      Patient is on parenteral testosterone replacement.     Further, the patient also has history of Vitamin D Deficiency and supplements vitamin D without any suspected side-effects. Last vitamin D was at goal:  Lab Results  Component Value Date   VD25OH 63 05/12/2018    Current Outpatient Medications on File Prior to Visit  Medication Sig  . cholecalciferol (VITAMIN D) 1000 units tablet Take 1,000 Units by mouth daily.  . Flaxseed, Linseed, (FLAXSEED OIL PO) Take 1,400 mg by mouth 2 (two) times daily.  . fluticasone (FLONASE) 50 MCG/ACT nasal spray Place into both nostrils as needed for allergies or rhinitis.  . meloxicam (MOBIC) 15 MG tablet Take 1/2 to 1 tablet Daily with Food for Pain & Inflammation  . multivitamin (ONE-A-DAY MEN'S) TABS tablet  Take 1 tablet by mouth daily.  . Omega-3 Fatty Acids (FISH OIL) 1200 MG CAPS Take by mouth 3 (three) times daily.  Marland Kitchen OVER THE COUNTER MEDICATION Takes allergy tablets PRN  . pravastatin (PRAVACHOL) 40 MG tablet Take 1 tablet at Bedtime for Cholesterol  . SYRINGE-NEEDLE, DISP, 3 ML (BD ECLIPSE SYRINGE) 21G X 1" 3 ML MISC Inject 1 Syringe into the muscle every 14 (fourteen) days.  Marland Kitchen terazosin (HYTRIN) 10 MG capsule Take Capsule at Bedtime for Prostate  . testosterone cypionate (DEPOTESTOSTERONE CYPIONATE) 200 MG/ML injection INJECT 2 MLS INTRAMUSCULARLY EVERY 2 WEEK  . OVER THE COUNTER MEDICATION Omeprazole, 1 tablet at night  . pantoprazole (PROTONIX) 40 MG tablet Take 1 tablet Daily for Prevent Heartburn & Indigestion.   No current facility-administered medications on file prior to visit.     No Known Allergies  PMHx:  History reviewed. No pertinent past medical history.   History reviewed. No pertinent surgical history.  FHx:    Reviewed / unchanged  SHx:    Reviewed / unchanged   Systems Review:  Constitutional: Denies fever, chills, wt changes, headaches, insomnia, fatigue, night sweats, change in appetite. Eyes: Denies redness, blurred vision, diplopia, discharge, itchy, watery eyes.  ENT: Denies discharge, congestion, post nasal drip, epistaxis, sore throat, earache, hearing loss, dental pain, tinnitus, vertigo, sinus pain, snoring.  CV: Denies chest pain, palpitations, irregular heartbeat,  syncope, dyspnea, diaphoresis, orthopnea, PND, claudication or edema. Respiratory: denies cough, dyspnea, DOE, pleurisy, hoarseness, laryngitis, wheezing.  Gastrointestinal: Denies dysphagia, odynophagia, heartburn, reflux, water brash, abdominal pain or cramps, nausea, vomiting, bloating, diarrhea, constipation, hematemesis, melena, hematochezia  or hemorrhoids. Genitourinary: Denies dysuria, frequency, urgency, nocturia, hesitancy, discharge, hematuria or flank pain. Musculoskeletal: Denies  arthralgias, myalgias, stiffness, jt. swelling, pain, limping or strain/sprain.  Skin: Denies pruritus, rash, hives, warts, acne, eczema or change in skin lesion(s). Neuro: No weakness, tremor, incoordination, spasms, paresthesia or pain. Psychiatric: Denies confusion, memory loss or sensory loss. Endo: Denies change in weight, skin or hair change.  Heme/Lymph: No excessive bleeding, bruising or enlarged lymph nodes.  Physical Exam  BP 102/66   Pulse 68   Temp (!) 97.2 F (36.2 C)   Resp 16   Ht 5\' 9"  (1.753 m)   Wt 182 lb 6.4 oz (82.7 kg)   BMI 26.94 kg/m   Appears  well nourished, well groomed  and in no distress.  Eyes: PERRLA, EOMs, conjunctiva no swelling or erythema. Sinuses: No frontal/maxillary tenderness ENT/Mouth: EAC's clear, TM's nl w/o erythema, bulging. Nares clear w/o erythema, swelling, exudates. Oropharynx clear without erythema or exudates. Oral hygiene is good. Tongue normal, non obstructing. Hearing intact.  Neck: Supple. Thyroid not palpable. Car 2+/2+ without bruits, nodes or JVD. Chest: Respirations nl with BS clear & equal w/o rales, rhonchi, wheezing or stridor.  Cor: Heart sounds normal w/ regular rate and rhythm without sig. murmurs, gallops, clicks or rubs. Peripheral pulses normal and equal  without edema.  Abdomen: Soft & bowel sounds normal. Non-tender w/o guarding, rebound, hernias, masses or organomegaly.  Lymphatics: Unremarkable.  Musculoskeletal: Full ROM all peripheral extremities, joint stability, 5/5 strength and normal gait.  Skin: Warm, dry without exposed rashes, lesions or ecchymosis apparent.  Neuro: Cranial nerves intact, reflexes equal bilaterally. Sensory-motor testing grossly intact. Tendon reflexes grossly intact.  Pysch: Alert & oriented x 3.  Insight and judgement nl & appropriate. No ideations.  Assessment and Plan:  1. Essential hypertension  - Continue medication, monitor blood pressure at home.  - Continue DASH diet.   Reminder to go to the ER if any CP,  SOB, nausea, dizziness, severe HA, changes vision/speech.  - CBC with Diff - COMPLETE METABOLIC PANEL WITH GFR - Magnesium - TSH  2. Hyperlipidemia, mixed  - Continue diet/meds, exercise,& lifestyle modifications.  - Continue monitor periodic cholesterol/liver & renal functions   - Lipid Profile - TSH  3. Abnormal glucose  - Continue diet, exercise  - Lifestyle modifications.  - Monitor appropriate labs.  - Hemoglobin A1c ([facility]) - Insulin, random  4. Vitamin D deficiency  - Continue supplementation.  - Vitamin D (25 hydroxy)  5. Stage 3 chronic kidney disease  - COMPLETE METABOLIC PANEL WITH GFR  6. Gastroesophageal reflux disease without esophagitis  - CBC with Diff  7. Medication management  - CBC with Diff - COMPLETE METABOLIC PANEL WITH GFR - Magnesium - Lipid Profile - TSH - Hemoglobin A1c ([facility]) - Insulin, random - Vitamin D (25 hydroxy)        Discussed  regular exercise, BP monitoring, weight control to achieve/maintain BMI less than 25 and discussed med and SE's. Recommended labs to assess and monitor clinical status with further disposition pending results of labs.  I discussed the assessment and treatment plan with the patient. The patient was provided an opportunity to ask questions and all were answered. The patient agreed with the plan and demonstrated an understanding  of the instructions.  I provided over 30 minutes of exam, counseling, chart review and  complex critical decision making.  Kirtland Bouchard, MD

## 2019-03-08 ENCOUNTER — Other Ambulatory Visit: Payer: Self-pay | Admitting: Internal Medicine

## 2019-03-08 MED ORDER — ROSUVASTATIN CALCIUM 20 MG PO TABS: ORAL_TABLET | ORAL | 3 refills | Status: DC

## 2019-03-08 NOTE — Progress Notes (Signed)
03/08/2019---patient is aware of labs results and instructions. - e welch

## 2019-03-09 LAB — INSULIN, RANDOM: Insulin: 2.3 u[IU]/mL

## 2019-03-09 LAB — LIPID PANEL
Cholesterol: 167 mg/dL (ref <200)
HDL: 39 mg/dL — ABNORMAL LOW (ref >=40)
LDL Cholesterol (Calc): 109 mg/dL (calc) — ABNORMAL HIGH
Non-HDL Cholesterol (Calc): 128 mg/dL (calc) (ref <130)
Total CHOL/HDL Ratio: 4.3 (calc) (ref <5.0)
Triglycerides: 91 mg/dL (ref <150)

## 2019-03-09 LAB — HEMOGLOBIN A1C
Hgb A1c MFr Bld: 5.3 % of total Hgb (ref <5.7)
Mean Plasma Glucose: 105 (calc)
eAG (mmol/L): 5.8 (calc)

## 2019-03-09 LAB — COMPLETE METABOLIC PANEL WITH GFR
AG Ratio: 1.9 (calc) (ref 1.0–2.5)
ALT: 20 U/L (ref 9–46)
AST: 22 U/L (ref 10–35)
Albumin: 4.3 g/dL (ref 3.6–5.1)
Alkaline phosphatase (APISO): 51 U/L (ref 35–144)
BUN: 15 mg/dL (ref 7–25)
CO2: 28 mmol/L (ref 20–32)
Calcium: 9.3 mg/dL (ref 8.6–10.3)
Chloride: 104 mmol/L (ref 98–110)
Creat: 1.13 mg/dL (ref 0.70–1.25)
GFR, Est African American: 78 mL/min/{1.73_m2} (ref >=60)
GFR, Est Non African American: 67 mL/min/{1.73_m2} (ref >=60)
Globulin: 2.3 g/dL (calc) (ref 1.9–3.7)
Glucose, Bld: 92 mg/dL (ref 65–99)
Potassium: 4.6 mmol/L (ref 3.5–5.3)
Sodium: 141 mmol/L (ref 135–146)
Total Bilirubin: 1 mg/dL (ref 0.2–1.2)
Total Protein: 6.6 g/dL (ref 6.1–8.1)

## 2019-03-09 LAB — CBC WITH DIFFERENTIAL/PLATELET
Absolute Monocytes: 485 cells/uL (ref 200–950)
Basophils Absolute: 38 cells/uL (ref 0–200)
Basophils Relative: 0.6 %
Eosinophils Absolute: 340 cells/uL (ref 15–500)
Eosinophils Relative: 5.4 %
HCT: 44.5 % (ref 38.5–50.0)
Hemoglobin: 15.2 g/dL (ref 13.2–17.1)
Lymphs Abs: 1632 cells/uL (ref 850–3900)
MCH: 32.4 pg (ref 27.0–33.0)
MCHC: 34.2 g/dL (ref 32.0–36.0)
MCV: 94.9 fL (ref 80.0–100.0)
MPV: 9.9 fL (ref 7.5–12.5)
Monocytes Relative: 7.7 %
Neutro Abs: 3805 cells/uL (ref 1500–7800)
Neutrophils Relative %: 60.4 %
Platelets: 301 10*3/uL (ref 140–400)
RBC: 4.69 10*6/uL (ref 4.20–5.80)
RDW: 12 % (ref 11.0–15.0)
Total Lymphocyte: 25.9 %
WBC: 6.3 10*3/uL (ref 3.8–10.8)

## 2019-03-09 LAB — MAGNESIUM: Magnesium: 1.8 mg/dL (ref 1.5–2.5)

## 2019-03-09 LAB — TSH: TSH: 1.79 mIU/L (ref 0.40–4.50)

## 2019-03-09 LAB — VITAMIN D 25 HYDROXY (VIT D DEFICIENCY, FRACTURES): Vit D, 25-Hydroxy: 89 ng/mL (ref 30–100)

## 2019-03-26 IMAGING — CR DG CHEST 2V
2 series · 2 of 2 positions shown · non-contrast
Comparison: 12/18/2005 chest radiograph.

CLINICAL DATA: Productive cough for 2 weeks.

EXAM:
CHEST - 2 VIEW

[chest pa]
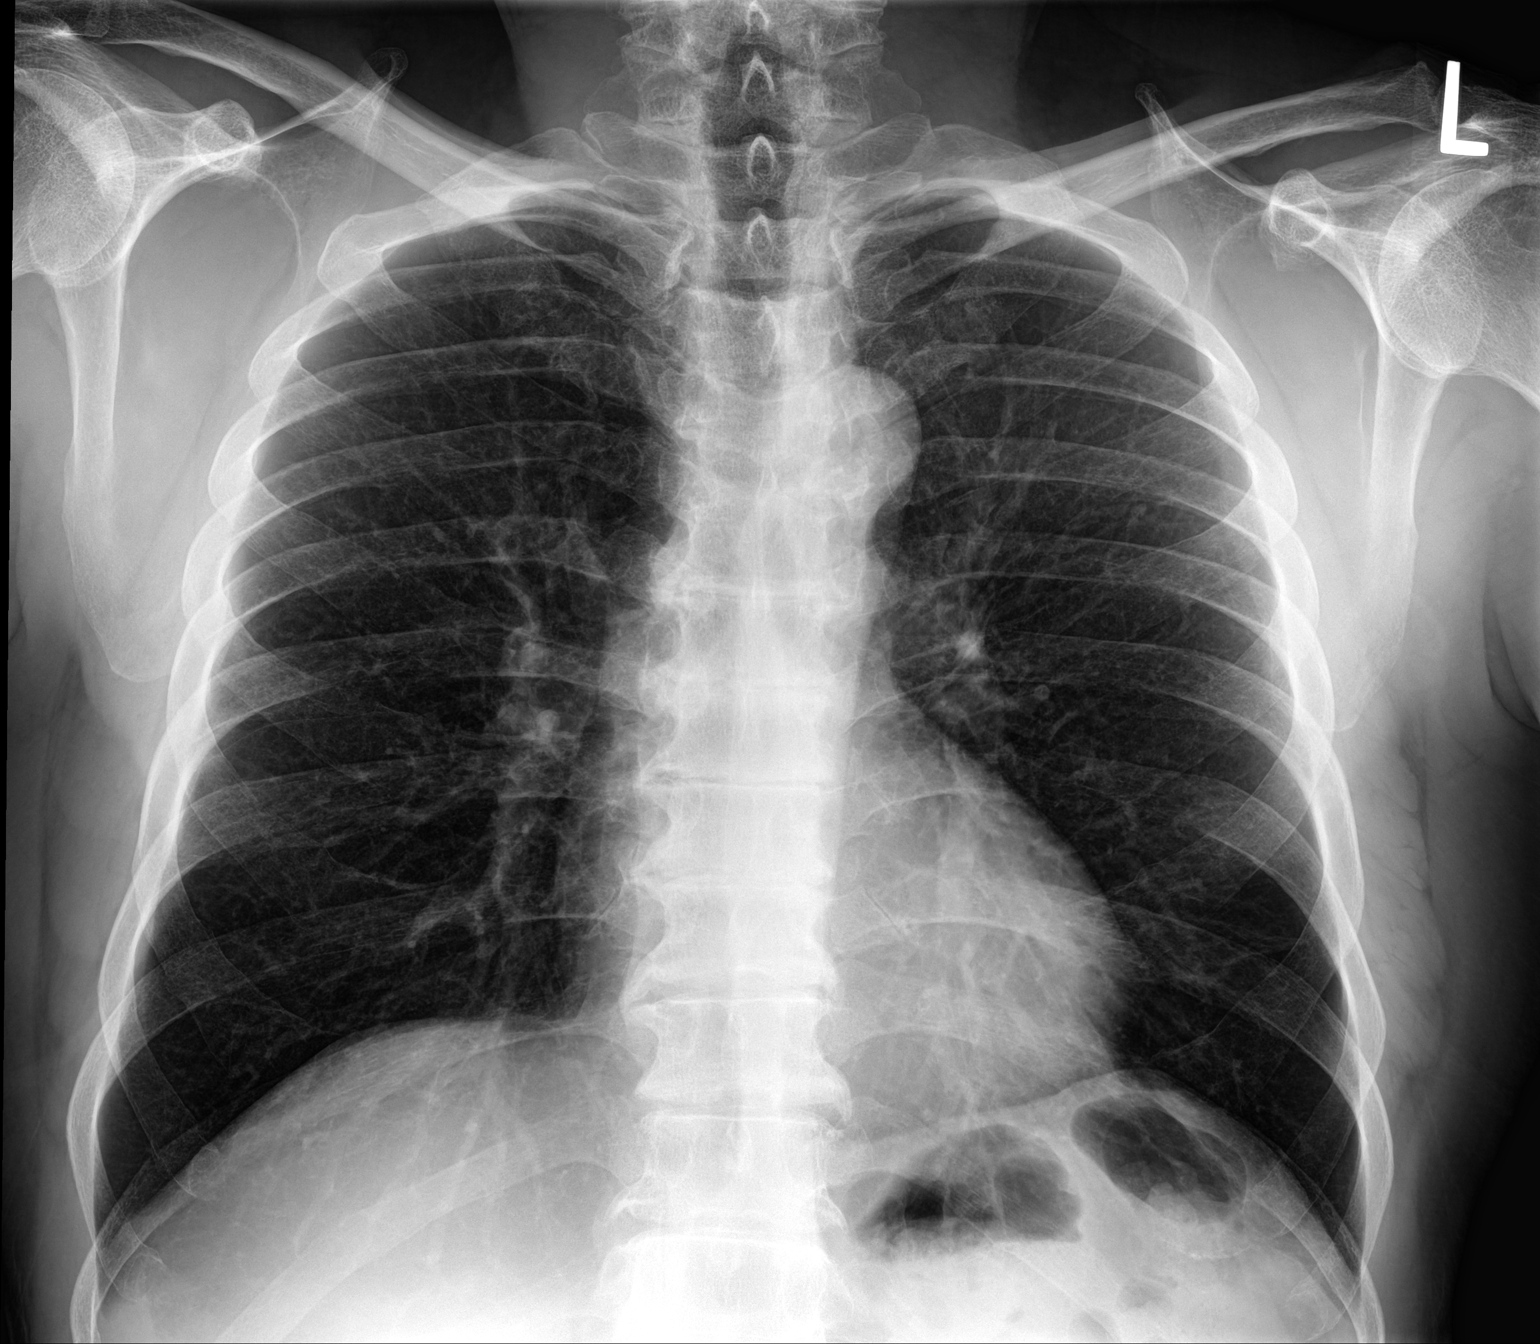

[chest lat]
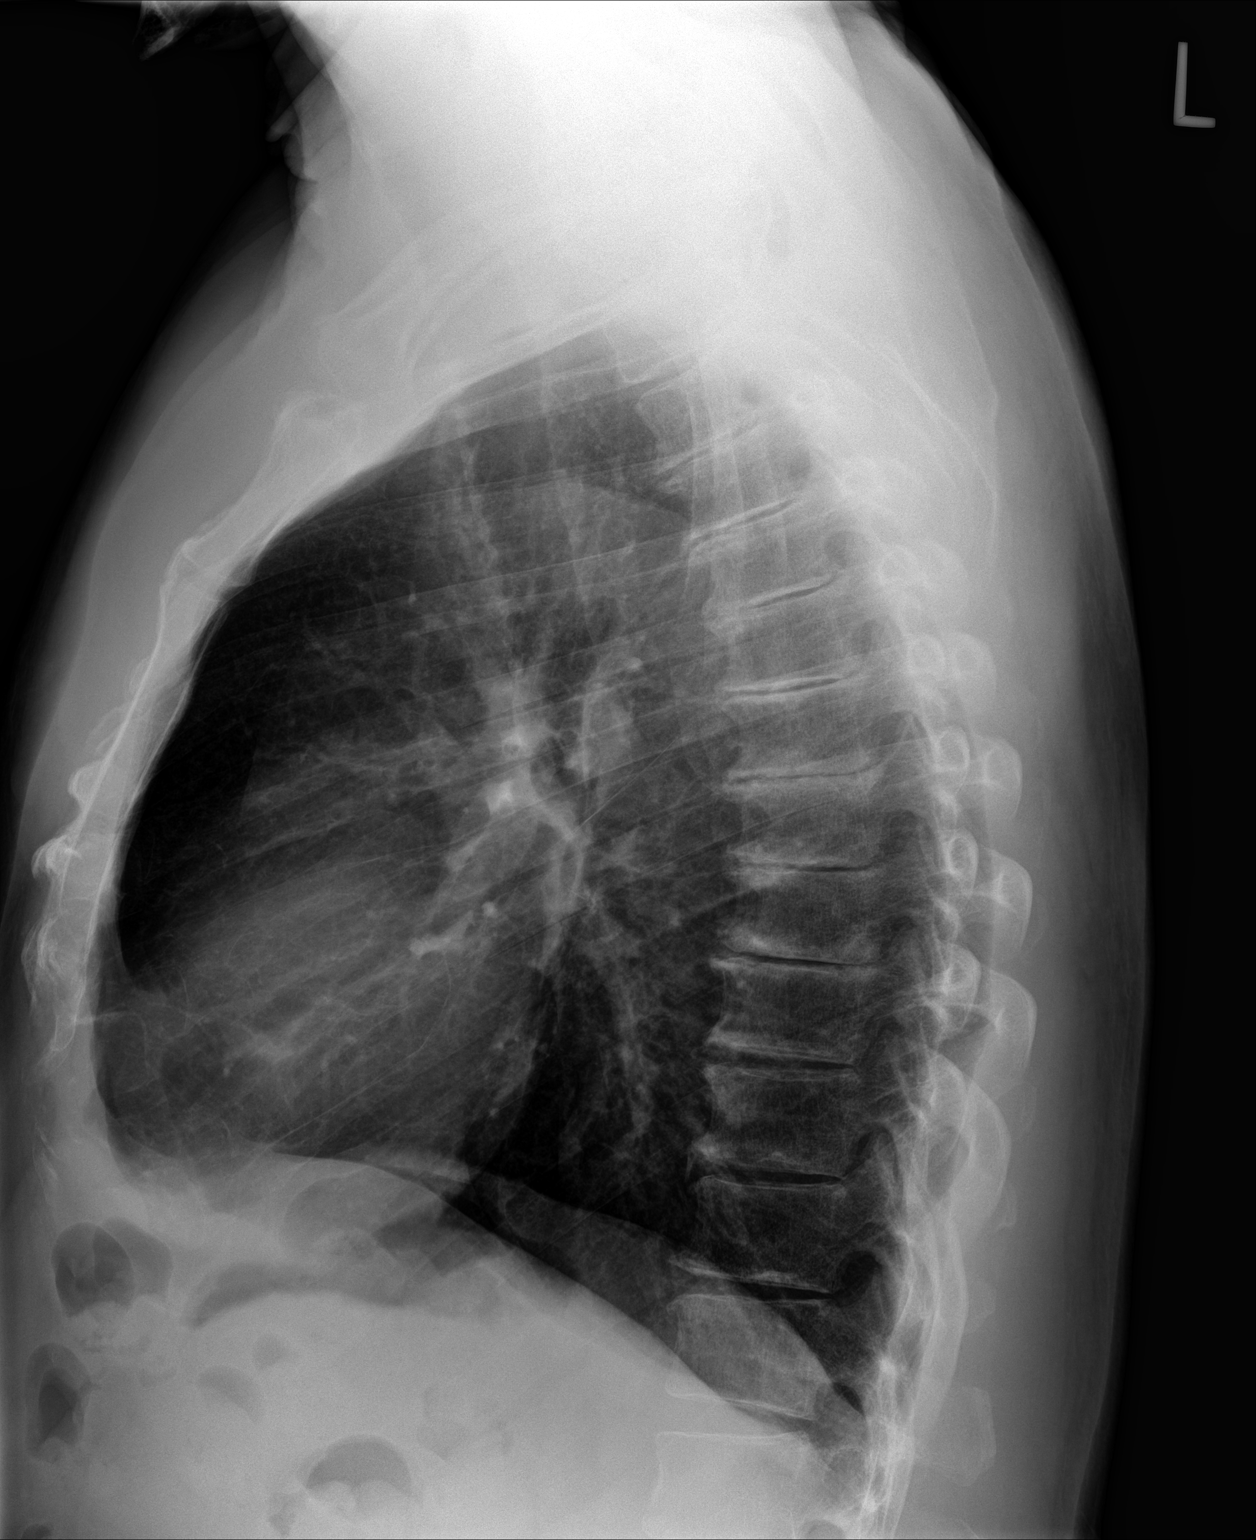

[2 of 2 positions shown; findings below may reference images not displayed]

FINDINGS: Stable cardiomediastinal silhouette with normal heart size. No
pneumothorax. No pleural effusion. Lungs appear clear, with no acute
consolidative airspace disease and no pulmonary edema.
IMPRESSION: No active cardiopulmonary disease.

## 2019-05-01 ENCOUNTER — Other Ambulatory Visit: Payer: Self-pay | Admitting: Internal Medicine

## 2019-05-18 ENCOUNTER — Encounter: Payer: Self-pay | Admitting: Internal Medicine

## 2019-05-20 ENCOUNTER — Other Ambulatory Visit: Payer: Self-pay | Admitting: Internal Medicine

## 2019-05-20 DIAGNOSIS — E349 Endocrine disorder, unspecified: Secondary | ICD-10-CM

## 2019-06-08 ENCOUNTER — Encounter: Payer: Self-pay | Admitting: Internal Medicine

## 2019-06-09 ENCOUNTER — Ambulatory Visit: Payer: PPO | Admitting: Adult Health

## 2019-07-05 NOTE — Progress Notes (Signed)
MEDICARE ANNUAL WELLNESS VISIT AND FOLLOW UP Assessment:    Medicare preventive visit  Labile hypertension Continue medications Monitor blood pressure at home; call if consistently over 130/80 Continue DASH diet.   Reminder to go to the ER if any CP, SOB, nausea, dizziness, severe HA, changes vision/speech, left arm numbness and tingling and jaw pain.  Left anterior fascicular block Monitor; EKG at CPE, no rate controlling drugs; refer to cardiology if progresses  Hyperlipidemia, mixed Continue medications: rosuvastatin  LDL goal <100 Continue low cholesterol diet and exercise.  Check lipid panel.   Vitamin D deficiency Continue supplementation for goal of 60-100 At goal last check; defer vitamin D level  Testosterone deficiency continue to monitor, states medication is helping with symptoms of low T.   Acid reflux Well managed on current medications, failed H2i taper attempt  Discussed diet, avoiding triggers and other lifestyle changes  Chronic pain due to trauma Lower back pain; well managed by chiropractor visits and regular exercise  BMI 25.0-25.9,adult Continue to recommend diet heavy in fruits and veggies and low in animal meats, cheeses, and dairy products, appropriate calorie intake Discuss exercise recommendations routinely Continue to monitor weight at each visit  CKD III ([place]) Increase fluids, avoid NSAIDS, monitor sugars, will monitor   Over 30 minutes of exam, counseling, chart review, and critical decision making was performed  Future Appointments  Date Time Provider [place]  09/27/2019  2:00 PM [name], MD [facility] None     Plan:   During the course of the visit the patient was educated and counseled about appropriate screening and preventive services including:    Pneumococcal vaccine   Influenza vaccine  Prevnar 13  Td vaccine  Screening electrocardiogram  Colorectal cancer screening  Diabetes  screening  Glaucoma screening  Nutrition counseling    Subjective:  Christian Dixon is a 69 y.o. male presents for Annual Medicare Wellness visit and follow up for chronic medication conditions.   New to our office in 2020, moved from [place].   he has a diagnosis of reflux which is currently managed by pantopazole 40 mg daily, did famotidine taper last year, ? failed he reports symptoms is currently well controlled, and denies breakthrough reflux, burning in chest, hoarseness or cough.    BMI is Body mass index is 27.32 kg/m., he has been working on diet and exercise. Martial arts 4 days a week.  Wt Readings from Last 3 Encounters:  07/06/19 185 lb (83.9 kg)  03/07/19 182 lb 6.4 oz (82.7 kg)  10/20/18 184 lb (83.5 kg)   His blood pressure has been controlled at home, today their BP is BP: 104/64  He does workout. He denies chest pain, shortness of breath, dizziness.   He is on cholesterol medication (rosuvastatin 20 mg daily) and denies myalgias. His cholesterol is not at goal. The cholesterol last visit was:   Lab Results  Component Value Date   CHOL 167 03/07/2019   HDL 39 (L) 03/07/2019   LDLCALC 109 (H) 03/07/2019   TRIG 91 03/07/2019   CHOLHDL 4.3 03/07/2019    He has been working on diet and exercise for glucose management, and denies nausea, paresthesia of the feet, polydipsia, polyuria, visual disturbances and vomiting. Last A1C in the office was:  Lab Results  Component Value Date   HGBA1C 5.3 03/07/2019   Lab Results  Component Value Date   GFRNONAA 67 03/07/2019   Patient is on Vitamin D supplement.   Lab Results  Component Value  Date   VD25OH 89 03/07/2019     He has a history of testosterone deficiency and is on testosterone replacement, taking 400 mg every 2 weeks, last shot was 8 days ago. He states that the testosterone helps with his energy, libido, muscle mass. Lab Results  Component Value Date   TESTOSTERONE 1,224 (H) 05/12/2018   He is on  hytrin for BP and LUTS; nocturia x 2 most nights Lab Results  Component Value Date   PSA 0.5 05/12/2018     Medication Review:  Current Outpatient Medications (Endocrine & Metabolic):  .  testosterone cypionate (DEPOTESTOSTERONE CYPIONATE) 200 MG/ML injection, INJECT 2 ML INTRAMUSCULARLY  EVERY TWO WEEKS  Current Outpatient Medications (Cardiovascular):  .  rosuvastatin (CRESTOR) 20 MG tablet, Take 1 tablet daily for Cholesterol .  terazosin (HYTRIN) 10 MG capsule, Take Capsule at Bedtime for Prostate  Current Outpatient Medications (Respiratory):  .  fluticasone (FLONASE) 50 MCG/ACT nasal spray, Place into both nostrils as needed for allergies or rhinitis.  Current Outpatient Medications (Analgesics):  .  meloxicam (MOBIC) 15 MG tablet, Take 1/2 to 1 tablet Daily with Food for Pain & Inflammation   Current Outpatient Medications (Other):  .  cholecalciferol (VITAMIN D) 1000 units tablet, Take 1,000 Units by mouth daily. .  Flaxseed, Linseed, (FLAXSEED OIL PO), Take 1,400 mg by mouth 2 (two) times daily. .  multivitamin (ONE-A-DAY MEN'S) TABS tablet, Take 1 tablet by mouth daily. .  Omega-3 Fatty Acids (FISH OIL) 1200 MG CAPS, Take by mouth 3 (three) times daily. Marland Kitchen  OVER THE COUNTER MEDICATION, Takes allergy tablets PRN .  OVER THE COUNTER MEDICATION, Omeprazole, 1 tablet at night .  pantoprazole (PROTONIX) 40 MG tablet, Take 1 tablet Daily for Indigestion & Heartburn .  SYRINGE-NEEDLE, DISP, 3 ML (BD ECLIPSE SYRINGE) 21G X 1" 3 ML MISC, Inject 1 Syringe into the muscle every 14 (fourteen) days.  Allergies: No Known Allergies  Current Problems (verified) has Labile hypertension; Hyperlipidemia, mixed; Vitamin D deficiency; Testosterone deficiency; Chronic pain due to trauma; BMI 26.0-26.9,adult; Left anterior fascicular block (LAFB); Acid reflux; and CKD (chronic kidney disease) stage 3, GFR 30-59 ml/min on their problem list.  Screening Tests  There is no immunization  history on file for this patient.  Preventative care: Last colonoscopy: never, Last cologuard: 09/2017 neg CXR 11/2017, normal, remote former light smoker    Prior vaccinations: TD or Tdap: has had in last 10 years, declines Influenza: declines  Pneumococcal: declines Prevnar13: declines Shingles/Zostavax: declines   Names of Other Physician/Practitioners you currently use: 1. [facility] here for primary care 2. - , eye doctor, last visit remote, has 67 year old glasses, patient will schedule to follow up  3. - , dentist, last visit remote, full dentures  Patient Care Team: [name], MD as PCP - General (Internal Medicine)  Surgical: He  has no past surgical history on file. Family His family history is not on file. Social history  He reports that he has quit smoking. He has never used smokeless tobacco. He reports that he does not drink alcohol. No history on file for drug.  MEDICARE WELLNESS OBJECTIVES: Physical activity: Current Exercise Habits: Home exercise routine, Type of exercise: Other - see comments(martial arts), Frequency (Times/Week): 4, Intensity: Mild, Exercise limited by: None identified Cardiac risk factors: Cardiac Risk Factors include: advanced age (>39men, >27 women);dyslipidemia;hypertension;male gender;smoking/ tobacco exposure Depression/mood screen:   Depression screen [facility] 2/9 07/06/2019  Decreased Interest 0  Down, Depressed,  Hopeless 0  PHQ - 2 Score 0    ADLs:  In your present state of health, do you have any difficulty performing the following activities: 07/06/2019 01/23/2019  Hearing? N N  Vision? N N  Difficulty concentrating or making decisions? N N  Walking or climbing stairs? N N  Dressing or bathing? N N  Doing errands, shopping? N -  Some recent data might be hidden     Cognitive Testing  Alert? Yes  Normal Appearance?Yes  Oriented to person? Yes  Place? Yes   Time? Yes  Recall of three  objects?  Yes  Can perform simple calculations? Yes  Displays appropriate judgment?Yes  Can read the correct time from a watch face?Yes  EOL planning: Does Patient Have a Medical Advance Directive?: No Would patient like information on creating a medical advance directive?: No - Patient declined   Objective:   Today's Vitals   07/06/19 1352  BP: 104/64  Pulse: 68  Temp: (!) 97.5 F (36.4 C)  SpO2: 95%  Weight: 185 lb (83.9 kg)   Body mass index is 27.32 kg/m.  General appearance: alert, no distress, WD/WN, male HEENT: normocephalic, sclerae anicteric, TMs pearly, nares patent, no discharge or erythema, pharynx normal Oral cavity: MMM, no lesions Neck: supple, no lymphadenopathy, no thyromegaly, no masses Heart: RRR, normal S1, S2, no murmurs Lungs: CTA bilaterally, no wheezes, rhonchi, or rales Abdomen: +bs, soft, non tender, non distended, no masses, no hepatomegaly, no splenomegaly Musculoskeletal: nontender, no swelling, no obvious deformity Extremities: no edema, no cyanosis, no clubbing Pulses: 2+ symmetric, upper and lower extremities, normal cap refill Neurological: alert, oriented x 3, CN2-12 intact, strength normal upper extremities and lower extremities, sensation normal throughout, DTRs 2+ throughout, no cerebellar signs, gait normal Psychiatric: normal affect, behavior normal, pleasant   Medicare Attestation I have personally reviewed: The patient's medical and social history Their use of alcohol, tobacco or illicit drugs Their current medications and supplements The patient's functional ability including ADLs,fall risks, home safety risks, cognitive, and hearing and visual impairment Diet and physical activities Evidence for depression or mood disorders  The patient's weight, height, BMI, and visual acuity have been recorded in the chart.  I have made referrals, counseling, and provided education to the patient based on review of the above and I have provided  the patient with a written personalized care plan for preventive services.     Izora Ribas, NP   07/06/2019

## 2019-07-06 ENCOUNTER — Ambulatory Visit (INDEPENDENT_AMBULATORY_CARE_PROVIDER_SITE_OTHER): Payer: PPO | Admitting: Adult Health

## 2019-07-06 ENCOUNTER — Other Ambulatory Visit: Payer: Self-pay

## 2019-07-06 ENCOUNTER — Encounter: Payer: Self-pay | Admitting: Adult Health

## 2019-07-06 VITALS — BP 104/64 | HR 68 | Temp 97.5°F | Wt 185.0 lb

## 2019-07-06 DIAGNOSIS — E559 Vitamin D deficiency, unspecified: Secondary | ICD-10-CM

## 2019-07-06 DIAGNOSIS — R6889 Other general symptoms and signs: Secondary | ICD-10-CM | POA: Diagnosis not present

## 2019-07-06 DIAGNOSIS — Z6826 Body mass index (BMI) 26.0-26.9, adult: Secondary | ICD-10-CM

## 2019-07-06 DIAGNOSIS — E782 Mixed hyperlipidemia: Secondary | ICD-10-CM

## 2019-07-06 DIAGNOSIS — K219 Gastro-esophageal reflux disease without esophagitis: Secondary | ICD-10-CM | POA: Diagnosis not present

## 2019-07-06 DIAGNOSIS — R0989 Other specified symptoms and signs involving the circulatory and respiratory systems: Secondary | ICD-10-CM

## 2019-07-06 DIAGNOSIS — Z1159 Encounter for screening for other viral diseases: Secondary | ICD-10-CM | POA: Diagnosis not present

## 2019-07-06 DIAGNOSIS — I444 Left anterior fascicular block: Secondary | ICD-10-CM

## 2019-07-06 DIAGNOSIS — Z0000 Encounter for general adult medical examination without abnormal findings: Secondary | ICD-10-CM

## 2019-07-06 DIAGNOSIS — Z0001 Encounter for general adult medical examination with abnormal findings: Secondary | ICD-10-CM

## 2019-07-06 DIAGNOSIS — E349 Endocrine disorder, unspecified: Secondary | ICD-10-CM | POA: Diagnosis not present

## 2019-07-06 DIAGNOSIS — N183 Chronic kidney disease, stage 3 unspecified: Secondary | ICD-10-CM

## 2019-07-06 DIAGNOSIS — G8921 Chronic pain due to trauma: Secondary | ICD-10-CM | POA: Diagnosis not present

## 2019-07-06 NOTE — Patient Instructions (Addendum)
Christian Dixon , Thank you for taking time to come for your Medicare Wellness Visit. I appreciate your ongoing commitment to your health goals. Please review the following plan we discussed and let me know if I can assist you in the future.   These are the goals we discussed: Goals    . DIET - INCREASE WATER INTAKE     65+ fluid ounces of water or sugar free liquid daily       This is a list of the screening recommended for you and due dates:  Health Maintenance  Topic Date Due  .  Hepatitis C: One time screening is recommended by Center for Disease Control  (CDC) for  adults born from [birth year] through 1963.   10/20/2019*  . Tetanus Vaccine  10/19/2021*  . Flu Shot  10/30/2019  . Cologuard (Stool DNA test)  10/28/2020  . Pneumonia vaccines  Discontinued  *Topic was postponed. The date shown is not the original due date.      Please schedule an eye exam      Jammed Finger A jammed finger is an injury to the ligaments that support your finger bones. Ligaments are bands of tissue that connect bones to each other. This injury happens when the ligaments are stretched beyond their normal range of motion (sprained). Symptoms may include:  Pain.  Swelling.  Discoloration and bruising around the joint.  Difficulty bending or straightening (extending) the finger.  Not being able to use the finger normally. Treatment may include:  Wearing a splint to keep the finger in place while it heals.  Taping the injured finger to the fingers beside it (buddy taping) to keep it in place.  Pain medicines.  Physical therapy to help you regain finger strength and range of motion. Follow these instructions at home: If you have a splint or buddy taping:   Wear the splint or tape as told by your health care provider. Remove the splint or tape only as told by your health care provider.  Loosen the splint or tape if your finger tingles, becomes numb, or turns cold and blue.  Cover the splint or  tape with a watertight covering when you take a bath or a shower.  If you have buddy taping, ask your health care provider if it needs to be adjusted or removed and redone periodically. Managing pain, stiffness, and swelling  If directed, put ice on the injured finger: ? Put ice in a plastic bag. ? Place a towel between your skin and the bag. ? Leave the ice on for 20 minutes, 2-3 times a day.  Raise (elevate) the injured finger above the level of your heart while you are sitting or lying down. General instructions  Take over-the-counter and prescription medicines only as told by your health care provider.  Rest your finger until your health care provider says you can move it again. Your finger may feel stiff and painful for a while.  Do physical therapy exercises as directed. It may help to start doing these exercises with your hand in a bowl of warm water.  Keep all follow-up visits as told by your health care provider. This is important. Contact a health care provider if:  You have pain or swelling that gets worse or does not get better with medicine.  You have been treated for your jammed finger but you still cannot extend the finger.  You have a fever. Get help right away if:  Even after loosening your splint or  tape, your finger: ? Is very red and swollen. ? Is white or blue. ? Feels tingly or becomes numb. Summary  A jammed finger is an injury to the ligaments that support the finger bones.  This injury happens when the ligaments are stretched beyond their normal range of motion (sprained).  Treatment may include a splint or buddy taping. This information is not intended to replace advice given to you by your health care provider. Make sure you discuss any questions you have with your health care provider. Document Revised: 07/09/2018 Document Reviewed: 12/30/2016 Elsevier Patient Education  [place].

## 2019-07-07 LAB — LIPID PANEL
Cholesterol: 121 mg/dL (ref <200)
HDL: 39 mg/dL — ABNORMAL LOW (ref >=40)
LDL Cholesterol (Calc): 59 mg/dL (calc)
Non-HDL Cholesterol (Calc): 82 mg/dL (calc) (ref <130)
Total CHOL/HDL Ratio: 3.1 (calc) (ref <5.0)
Triglycerides: 150 mg/dL — ABNORMAL HIGH (ref <150)

## 2019-07-07 LAB — COMPLETE METABOLIC PANEL WITH GFR
AG Ratio: 1.8 (calc) (ref 1.0–2.5)
ALT: 23 U/L (ref 9–46)
AST: 25 U/L (ref 10–35)
Albumin: 4.4 g/dL (ref 3.6–5.1)
Alkaline phosphatase (APISO): 46 U/L (ref 35–144)
BUN: 14 mg/dL (ref 7–25)
CO2: 27 mmol/L (ref 20–32)
Calcium: 9.8 mg/dL (ref 8.6–10.3)
Chloride: 107 mmol/L (ref 98–110)
Creat: 1.12 mg/dL (ref 0.70–1.25)
GFR, Est African American: 79 mL/min/{1.73_m2} (ref >=60)
GFR, Est Non African American: 68 mL/min/{1.73_m2} (ref >=60)
Globulin: 2.5 g/dL (calc) (ref 1.9–3.7)
Glucose, Bld: 94 mg/dL (ref 65–99)
Potassium: 4.6 mmol/L (ref 3.5–5.3)
Sodium: 142 mmol/L (ref 135–146)
Total Bilirubin: 1.5 mg/dL — ABNORMAL HIGH (ref 0.2–1.2)
Total Protein: 6.9 g/dL (ref 6.1–8.1)

## 2019-07-07 LAB — CBC WITH DIFFERENTIAL/PLATELET
Absolute Monocytes: 690 cells/uL (ref 200–950)
Basophils Absolute: 27 cells/uL (ref 0–200)
Basophils Relative: 0.4 %
Eosinophils Absolute: 248 cells/uL (ref 15–500)
Eosinophils Relative: 3.7 %
HCT: 45.8 % (ref 38.5–50.0)
Hemoglobin: 15.4 g/dL (ref 13.2–17.1)
Lymphs Abs: 1642 cells/uL (ref 850–3900)
MCH: 31.6 pg (ref 27.0–33.0)
MCHC: 33.6 g/dL (ref 32.0–36.0)
MCV: 93.9 fL (ref 80.0–100.0)
MPV: 9.6 fL (ref 7.5–12.5)
Monocytes Relative: 10.3 %
Neutro Abs: 4094 cells/uL (ref 1500–7800)
Neutrophils Relative %: 61.1 %
Platelets: 293 10*3/uL (ref 140–400)
RBC: 4.88 10*6/uL (ref 4.20–5.80)
RDW: 11.9 % (ref 11.0–15.0)
Total Lymphocyte: 24.5 %
WBC: 6.7 10*3/uL (ref 3.8–10.8)

## 2019-07-07 LAB — TSH: TSH: 1.68 mIU/L (ref 0.40–4.50)

## 2019-07-07 LAB — HEPATITIS C ANTIBODY
Hepatitis C Ab: NONREACTIVE
SIGNAL TO CUT-OFF: 0.01 (ref <1.00)

## 2019-07-07 LAB — MAGNESIUM: Magnesium: 2.1 mg/dL (ref 1.5–2.5)

## 2019-08-01 ENCOUNTER — Other Ambulatory Visit: Payer: Self-pay | Admitting: Internal Medicine

## 2019-09-26 NOTE — Progress Notes (Addendum)
  C  A  N  C  E  L  L  E  D   M  O  R  N  I  N  G    Of       A  P   P  'T

## 2019-09-27 ENCOUNTER — Ambulatory Visit: Payer: PPO | Admitting: Internal Medicine

## 2019-09-27 ENCOUNTER — Encounter: Payer: Self-pay | Admitting: Internal Medicine

## 2019-10-17 ENCOUNTER — Other Ambulatory Visit: Payer: Self-pay | Admitting: Internal Medicine

## 2019-10-17 DIAGNOSIS — G8921 Chronic pain due to trauma: Secondary | ICD-10-CM

## 2019-10-17 MED ORDER — MELOXICAM 15 MG PO TABS: ORAL_TABLET | ORAL | 1 refills | Status: DC

## 2019-10-20 ENCOUNTER — Other Ambulatory Visit: Payer: Self-pay | Admitting: Internal Medicine

## 2019-10-20 DIAGNOSIS — G8921 Chronic pain due to trauma: Secondary | ICD-10-CM

## 2019-10-20 MED ORDER — MELOXICAM 15 MG PO TABS: ORAL_TABLET | ORAL | 0 refills | Status: DC

## 2019-10-28 ENCOUNTER — Other Ambulatory Visit: Payer: Self-pay | Admitting: Internal Medicine

## 2019-10-28 DIAGNOSIS — G8921 Chronic pain due to trauma: Secondary | ICD-10-CM

## 2019-10-28 MED ORDER — MELOXICAM 15 MG PO TABS: ORAL_TABLET | ORAL | 1 refills | Status: DC

## 2019-11-02 ENCOUNTER — Other Ambulatory Visit: Payer: Self-pay | Admitting: Internal Medicine

## 2020-01-04 ENCOUNTER — Encounter: Payer: PPO | Admitting: Physician Assistant

## 2020-01-31 ENCOUNTER — Other Ambulatory Visit: Payer: Self-pay | Admitting: Internal Medicine

## 2020-01-31 MED ORDER — PANTOPRAZOLE SODIUM 40 MG PO TBEC: DELAYED_RELEASE_TABLET | ORAL | 0 refills | Status: DC

## 2020-02-18 ENCOUNTER — Encounter: Payer: Self-pay | Admitting: Internal Medicine

## 2020-02-18 NOTE — Patient Instructions (Signed)

## 2020-02-18 NOTE — Progress Notes (Signed)
Annual  Screening/Preventative Visit  & Comprehensive Evaluation & Examination      This very nice 69 y.o.   MWM  presents for a Screening /Preventative Visit & comprehensive evaluation and management of multiple medical co-morbidities.   Patient has been followed for HTN, HLD, Prediabetes, Testosterone Deficiency and Vitamin D Deficiency. Patient's GERD is controlled on his meds.      Patient  Is RH dominant & also c/o chronic Left shoulder pains limiting his ROM & requests referral.      HTN predates circa 2015. Patient's BP has been controlled at home.  Today's BP is at goal - 120/78. Patient denies any cardiac symptoms as chest pain, palpitations, shortness of breath, dizziness or ankle swelling.      Patient's hyperlipidemia is controlled with diet and Rosuvastatin. Patient denies myalgias or other medication SE's. Last lipids were at goal:    Lab Results  Component Value Date   CHOL 121 07/06/2019   HDL 39 (L) 07/06/2019   LDLCALC 59 07/06/2019   TRIG 150 (H) 07/06/2019   CHOLHDL 3.1 07/06/2019        Patient is followed expectantly for glucose intolerance and patient denies reactive hypoglycemic symptoms, visual blurring, diabetic polys or paresthesias. Last A1c was normal & at goal:   Lab Results  Component Value Date   HGBA1C 5.3 03/07/2019        Patient is on Parenteral Testosterone replacement every 10 days (last dose was 10 days ago) and he relates on therapy he has an improved sense of well being.      Finally, patient has history of Vitamin D Deficiency and last vitamin D was at goal:   Lab Results  Component Value Date   VD25OH 89 03/07/2019    Current Outpatient Medications on File Prior to Visit  Medication Sig  . VITAMIN D 1000 units tablet Take  daily.  Marland Kitchen FLAXSEED OIL 1,400 mg Take  2  times daily.  Marland Kitchen FLONASE nasal spray Place into nostrils as needed   . meloxicam 15 MG tablet Take 1/2 to 1 tablet Daily  . ONE-A-DAY MEN'S Take 1 tablet  daily.    . Omega-3 FISH OIL 1200 MG  Take  3  times daily.  Marland Kitchen allergy tablets Takes  PRN  . OTC Omeprazole 1 tablet at night  . pantoprazole 40 MG tablet Take 1 tablet Daily  . CRESTOR 20 MG tablet Take 1 tablet daily for Cholesterol  . terazosin  10 MG capsule Take Capsule at Bedtime for Prostate  . testosterone cypio 200 MG/ML injec INJECT 2 ML INTRAMUSCULARLY  EVERY TWO WEEKS    Health Maintenance  Topic Date Due  . COVID-19 Vaccine (1) Never done  . INFLUENZA VACCINE  Never done  . TETANUS/TDAP  10/19/2021 (Originally 10/29/1969)  . Fecal DNA (Cologuard)  10/28/2020  . Hepatitis C Screening  Completed  . PNA vac Low Risk Adult  Discontinued    There is no immunization history on file for this patient.  -  Patient refuses any Vaccinations  Last Colon - Patient refuses  Cologard - 10/28/2017 Negative & 3 year f/u due Aug 2023   Social History   Socioeconomic History  . Marital status: Married    Spouse name: Anderson Malta  . Number of children: 3 sons  Occupational History  . Retired  Tobacco Use  . Smoking status: Former Research scientist (life sciences)  . Smokeless tobacco: Never Used  Substance and Sexual Activity  . Alcohol use: No  .  Drug use: Not on file  . Sexual activity: Not on file    ROS Constitutional: Denies fever, chills, weight loss/gain, headaches, insomnia,  night sweats or change in appetite. Does c/o fatigue. Eyes: Denies redness, blurred vision, diplopia, discharge, itchy or watery eyes.  ENT: Denies discharge, congestion, post nasal drip, epistaxis, sore throat, earache, hearing loss, dental pain, Tinnitus, Vertigo, Sinus pain or snoring.  Cardio: Denies chest pain, palpitations, irregular heartbeat, syncope, dyspnea, diaphoresis, orthopnea, PND, claudication or edema Respiratory: denies cough, dyspnea, DOE, pleurisy, hoarseness, laryngitis or wheezing.  Gastrointestinal: Denies dysphagia, heartburn, reflux, water brash, pain, cramps, nausea, vomiting, bloating, diarrhea,  constipation, hematemesis, melena, hematochezia, jaundice or hemorrhoids Genitourinary: Denies dysuria, frequency, urgency, nocturia, hesitancy, discharge, hematuria or flank pain Musculoskeletal: Denies arthralgia, myalgia, stiffness, Jt. Swelling, pain, limp or strain/sprain. Denies Falls. Skin: Denies puritis, rash, hives, warts, acne, eczema or change in skin lesion Neuro: No weakness, tremor, incoordination, spasms, paresthesia or pain Psychiatric: Denies confusion, memory loss or sensory loss. Denies Depression. Endocrine: Denies change in weight, skin, hair change, nocturia, and paresthesia, diabetic polys, visual blurring or hyper / hypo glycemic episodes.  Heme/Lymph: No excessive bleeding, bruising or enlarged lymph nodes.  Physical Exam  BP 120/78   Pulse 72   Temp (!) 97 F (36.1 C)   Resp 16   Ht 5' 8.5" (1.74 m)   Wt 182 lb 9.6 oz (82.8 kg)   SpO2 96%   BMI 27.36 kg/m   General Appearance: Well nourished and well groomed and in no apparent distress.  Eyes: PERRLA, EOMs, conjunctiva no swelling or erythema, normal fundi and vessels. Sinuses: No frontal/maxillary tenderness ENT/Mouth: EACs patent / TMs  nl. Nares clear without erythema, swelling, mucoid exudates. Oral hygiene is good. No erythema, swelling, or exudate. Tongue normal, non-obstructing. Tonsils not swollen or erythematous. Hearing normal.  Neck: Supple, thyroid not palpable. No bruits, nodes or JVD. Respiratory: Respiratory effort normal.  BS equal and clear bilateral without rales, rhonci, wheezing or stridor. Cardio: Heart sounds are normal with regular rate and rhythm and no murmurs, rubs or gallops. Peripheral pulses are normal and equal bilaterally without edema. No aortic or femoral bruits. Chest: symmetric with normal excursions and percussion.  Abdomen: Soft, with Nl bowel sounds. Nontender, no guarding, rebound, hernias, masses, or organomegaly.  Lymphatics: Non tender without lymphadenopathy.    Musculoskeletal:  Decrerased ROM Lt shoulder limited by pain.  Normal gait. Skin: Warm and dry without rashes, lesions, cyanosis, clubbing or  ecchymosis.  Neuro: Cranial nerves intact, reflexes equal bilaterally. Normal muscle tone, no cerebellar symptoms. Sensation intact.  Pysch: Alert and oriented X 3 with normal affect, insight and judgment appropriate.   Assessment and Plan  1. Annual Preventative/Screening Exam    2. Essential hypertension  - EKG 12-Lead - [place], RETROPERITNL ABD,  LTD - Urinalysis, Routine w reflex microscopic - Microalbumin / creatinine urine ratio - CBC with Differential/Platelet - COMPLETE METABOLIC PANEL WITH GFR - Magnesium - TSH  3. Hyperlipidemia, mixed  - EKG 12-Lead - [place], RETROPERITNL ABD,  LTD - Lipid panel - TSH  4. Abnormal glucose  - EKG 12-Lead - [place], RETROPERITNL ABD,  LTD - Hemoglobin A1c - Insulin, random  5. Vitamin D deficiency  - VITAMIN D 25 Hydroxy   6. Testosterone deficiency  - Testosterone  7. BPH with obstruction/lower urinary tract symptoms  - PSA  8. Prostate cancer screening  - PSA  9. Screening for colorectal cancer  - POC Hemoccult Bld/Stl  10. Gastroesophageal reflux  disease   11. Stage 2 chronic kidney disease   12. Screening for ischemic heart disease  - EKG 12-Lead  13. FHx: heart disease  - EKG 12-Lead - [place], RETROPERITNL ABD,  LTD  14. Former smoker  - EKG 12-Lead - [place], RETROPERITNL ABD,  LTD  15. Screening for AAA (aortic abdominal aneurysm)  - [place], RETROPERITNL ABD,  LTD  16. Medication management  - Urinalysis, Routine w reflex microscopic - Microalbumin / creatinine urine ratio - Testosterone - CBC with Differential/Platelet - COMPLETE METABOLIC PANEL WITH GFR - Magnesium - Lipid panel - TSH - Hemoglobin A1c - Insulin, random - VITAMIN D 25 Hydroxy         Orthopedic referral for Left shoulder pain. Patient was counseled in prudent diet, weight control to  achieve/maintain BMI less than 25, BP monitoring, regular exercise and medications as discussed.  Discussed med effects and SE's. Routine screening labs and tests as requested with regular follow-up as recommended. Over 40 minutes of exam, counseling, chart review and high complex critical decision making was performed   Kirtland Bouchard, MD

## 2020-02-20 ENCOUNTER — Other Ambulatory Visit: Payer: Self-pay

## 2020-02-20 ENCOUNTER — Ambulatory Visit (INDEPENDENT_AMBULATORY_CARE_PROVIDER_SITE_OTHER): Payer: PPO | Admitting: Internal Medicine

## 2020-02-20 VITALS — BP 120/78 | HR 72 | Temp 97.0°F | Resp 16 | Ht 68.5 in | Wt 182.6 lb

## 2020-02-20 DIAGNOSIS — Z8249 Family history of ischemic heart disease and other diseases of the circulatory system: Secondary | ICD-10-CM

## 2020-02-20 DIAGNOSIS — N138 Other obstructive and reflux uropathy: Secondary | ICD-10-CM

## 2020-02-20 DIAGNOSIS — G8929 Other chronic pain: Secondary | ICD-10-CM

## 2020-02-20 DIAGNOSIS — Z125 Encounter for screening for malignant neoplasm of prostate: Secondary | ICD-10-CM | POA: Diagnosis not present

## 2020-02-20 DIAGNOSIS — I10 Essential (primary) hypertension: Secondary | ICD-10-CM | POA: Diagnosis not present

## 2020-02-20 DIAGNOSIS — Z79899 Other long term (current) drug therapy: Secondary | ICD-10-CM

## 2020-02-20 DIAGNOSIS — Z136 Encounter for screening for cardiovascular disorders: Secondary | ICD-10-CM

## 2020-02-20 DIAGNOSIS — Z87891 Personal history of nicotine dependence: Secondary | ICD-10-CM | POA: Diagnosis not present

## 2020-02-20 DIAGNOSIS — R7309 Other abnormal glucose: Secondary | ICD-10-CM

## 2020-02-20 DIAGNOSIS — E782 Mixed hyperlipidemia: Secondary | ICD-10-CM

## 2020-02-20 DIAGNOSIS — Z0000 Encounter for general adult medical examination without abnormal findings: Secondary | ICD-10-CM

## 2020-02-20 DIAGNOSIS — M25512 Pain in left shoulder: Secondary | ICD-10-CM

## 2020-02-20 DIAGNOSIS — Z1212 Encounter for screening for malignant neoplasm of rectum: Secondary | ICD-10-CM

## 2020-02-20 DIAGNOSIS — N182 Chronic kidney disease, stage 2 (mild): Secondary | ICD-10-CM

## 2020-02-20 DIAGNOSIS — K219 Gastro-esophageal reflux disease without esophagitis: Secondary | ICD-10-CM

## 2020-02-20 DIAGNOSIS — E349 Endocrine disorder, unspecified: Secondary | ICD-10-CM | POA: Diagnosis not present

## 2020-02-20 DIAGNOSIS — N401 Enlarged prostate with lower urinary tract symptoms: Secondary | ICD-10-CM | POA: Diagnosis not present

## 2020-02-20 DIAGNOSIS — Z0001 Encounter for general adult medical examination with abnormal findings: Secondary | ICD-10-CM

## 2020-02-20 DIAGNOSIS — E559 Vitamin D deficiency, unspecified: Secondary | ICD-10-CM | POA: Diagnosis not present

## 2020-02-20 DIAGNOSIS — Z1211 Encounter for screening for malignant neoplasm of colon: Secondary | ICD-10-CM

## 2020-02-21 ENCOUNTER — Other Ambulatory Visit: Payer: Self-pay | Admitting: Internal Medicine

## 2020-02-21 ENCOUNTER — Other Ambulatory Visit: Payer: Self-pay | Admitting: Adult Health

## 2020-02-21 DIAGNOSIS — E349 Endocrine disorder, unspecified: Secondary | ICD-10-CM

## 2020-02-21 LAB — CBC WITH DIFFERENTIAL/PLATELET
Absolute Monocytes: 550 cells/uL (ref 200–950)
Basophils Absolute: 32 cells/uL (ref 0–200)
Basophils Relative: 0.5 %
Eosinophils Absolute: 269 cells/uL (ref 15–500)
Eosinophils Relative: 4.2 %
HCT: 45.6 % (ref 38.5–50.0)
Hemoglobin: 15.5 g/dL (ref 13.2–17.1)
Lymphs Abs: 1485 cells/uL (ref 850–3900)
MCH: 31.9 pg (ref 27.0–33.0)
MCHC: 34 g/dL (ref 32.0–36.0)
MCV: 93.8 fL (ref 80.0–100.0)
MPV: 9.8 fL (ref 7.5–12.5)
Monocytes Relative: 8.6 %
Neutro Abs: 4064 cells/uL (ref 1500–7800)
Neutrophils Relative %: 63.5 %
Platelets: 249 10*3/uL (ref 140–400)
RBC: 4.86 10*6/uL (ref 4.20–5.80)
RDW: 12 % (ref 11.0–15.0)
Total Lymphocyte: 23.2 %
WBC: 6.4 10*3/uL (ref 3.8–10.8)

## 2020-02-21 LAB — URINALYSIS, ROUTINE W REFLEX MICROSCOPIC
Bilirubin Urine: NEGATIVE
Glucose, UA: NEGATIVE
Hgb urine dipstick: NEGATIVE
Ketones, ur: NEGATIVE
Leukocytes,Ua: NEGATIVE
Nitrite: NEGATIVE
Protein, ur: NEGATIVE
Specific Gravity, Urine: 1.019 (ref 1.001–1.03)
pH: <=5 (ref 5.0–8.0)

## 2020-02-21 LAB — COMPLETE METABOLIC PANEL WITH GFR
AG Ratio: 2 (calc) (ref 1.0–2.5)
ALT: 18 U/L (ref 9–46)
AST: 18 U/L (ref 10–35)
Albumin: 4.3 g/dL (ref 3.6–5.1)
Alkaline phosphatase (APISO): 50 U/L (ref 35–144)
BUN: 12 mg/dL (ref 7–25)
CO2: 29 mmol/L (ref 20–32)
Calcium: 9.2 mg/dL (ref 8.6–10.3)
Chloride: 105 mmol/L (ref 98–110)
Creat: 0.99 mg/dL (ref 0.70–1.25)
GFR, Est African American: 91 mL/min/{1.73_m2} (ref >=60)
GFR, Est Non African American: 78 mL/min/{1.73_m2} (ref >=60)
Globulin: 2.2 g/dL (calc) (ref 1.9–3.7)
Glucose, Bld: 87 mg/dL (ref 65–99)
Potassium: 4.4 mmol/L (ref 3.5–5.3)
Sodium: 143 mmol/L (ref 135–146)
Total Bilirubin: 1.2 mg/dL (ref 0.2–1.2)
Total Protein: 6.5 g/dL (ref 6.1–8.1)

## 2020-02-21 LAB — LIPID PANEL
Cholesterol: 115 mg/dL (ref <200)
HDL: 39 mg/dL — ABNORMAL LOW (ref >=40)
LDL Cholesterol (Calc): 54 mg/dL (calc)
Non-HDL Cholesterol (Calc): 76 mg/dL (calc) (ref <130)
Total CHOL/HDL Ratio: 2.9 (calc) (ref <5.0)
Triglycerides: 141 mg/dL (ref <150)

## 2020-02-21 LAB — MAGNESIUM: Magnesium: 1.8 mg/dL (ref 1.5–2.5)

## 2020-02-21 LAB — VITAMIN D 25 HYDROXY (VIT D DEFICIENCY, FRACTURES): Vit D, 25-Hydroxy: 78 ng/mL (ref 30–100)

## 2020-02-21 LAB — MICROALBUMIN / CREATININE URINE RATIO
Creatinine, Urine: 206 mg/dL (ref 20–320)
Microalb Creat Ratio: 5 mcg/mg creat (ref <30)
Microalb, Ur: 1 mg/dL

## 2020-02-21 LAB — INSULIN, RANDOM: Insulin: 9.7 u[IU]/mL

## 2020-02-21 LAB — PSA: PSA: 0.45 ng/mL (ref <=4.0)

## 2020-02-21 LAB — HEMOGLOBIN A1C
Hgb A1c MFr Bld: 5.4 % of total Hgb (ref <5.7)
Mean Plasma Glucose: 108 (calc)
eAG (mmol/L): 6 (calc)

## 2020-02-21 LAB — TESTOSTERONE: Testosterone: 906 ng/dL — ABNORMAL HIGH (ref 250–827)

## 2020-02-21 LAB — TSH: TSH: 1.32 mIU/L (ref 0.40–4.50)

## 2020-02-24 ENCOUNTER — Other Ambulatory Visit: Payer: Self-pay | Admitting: Internal Medicine

## 2020-02-24 DIAGNOSIS — N401 Enlarged prostate with lower urinary tract symptoms: Secondary | ICD-10-CM

## 2020-02-26 ENCOUNTER — Other Ambulatory Visit: Payer: Self-pay | Admitting: Internal Medicine

## 2020-03-08 ENCOUNTER — Encounter: Payer: Self-pay | Admitting: *Deleted

## 2020-03-21 DIAGNOSIS — M67912 Unspecified disorder of synovium and tendon, left shoulder: Secondary | ICD-10-CM | POA: Diagnosis not present

## 2020-05-03 ENCOUNTER — Other Ambulatory Visit: Payer: Self-pay | Admitting: Internal Medicine

## 2020-05-22 ENCOUNTER — Other Ambulatory Visit: Payer: Self-pay | Admitting: Internal Medicine

## 2020-05-22 DIAGNOSIS — N138 Other obstructive and reflux uropathy: Secondary | ICD-10-CM

## 2020-05-30 ENCOUNTER — Other Ambulatory Visit: Payer: Self-pay | Admitting: Internal Medicine

## 2020-06-20 ENCOUNTER — Ambulatory Visit: Payer: PPO | Admitting: Adult Health

## 2020-07-26 ENCOUNTER — Other Ambulatory Visit: Payer: Self-pay | Admitting: Internal Medicine

## 2020-09-20 ENCOUNTER — Ambulatory Visit: Payer: PPO | Admitting: Internal Medicine

## 2020-09-27 ENCOUNTER — Encounter: Payer: PPO | Admitting: Internal Medicine

## 2020-09-27 ENCOUNTER — Other Ambulatory Visit: Payer: Self-pay | Admitting: Internal Medicine

## 2020-09-27 DIAGNOSIS — E349 Endocrine disorder, unspecified: Secondary | ICD-10-CM

## 2020-10-02 ENCOUNTER — Encounter: Payer: Self-pay | Admitting: Internal Medicine

## 2020-10-02 ENCOUNTER — Other Ambulatory Visit: Payer: Self-pay

## 2020-10-02 ENCOUNTER — Ambulatory Visit (INDEPENDENT_AMBULATORY_CARE_PROVIDER_SITE_OTHER): Payer: PPO | Admitting: Internal Medicine

## 2020-10-02 VITALS — BP 110/76 | HR 68 | Temp 97.7°F | Resp 17 | Ht 69.0 in | Wt 180.4 lb

## 2020-10-02 DIAGNOSIS — Z79899 Other long term (current) drug therapy: Secondary | ICD-10-CM | POA: Diagnosis not present

## 2020-10-02 DIAGNOSIS — R7309 Other abnormal glucose: Secondary | ICD-10-CM

## 2020-10-02 DIAGNOSIS — E349 Endocrine disorder, unspecified: Secondary | ICD-10-CM

## 2020-10-02 DIAGNOSIS — E782 Mixed hyperlipidemia: Secondary | ICD-10-CM | POA: Diagnosis not present

## 2020-10-02 DIAGNOSIS — R0989 Other specified symptoms and signs involving the circulatory and respiratory systems: Secondary | ICD-10-CM | POA: Diagnosis not present

## 2020-10-02 DIAGNOSIS — E559 Vitamin D deficiency, unspecified: Secondary | ICD-10-CM

## 2020-10-02 NOTE — Patient Instructions (Signed)

## 2020-10-02 NOTE — Progress Notes (Signed)
Future Appointments  Date Time Provider Ord  10/02/2020  4:30 PM [name], MD [facility] None  03/06/2021 11:00 AM [name], MD [facility] None    History of Present Illness:       This very nice 70 y.o.  MWM presents for 6 month follow up with HTN, HLD, Pre-Diabetes and Vitamin D Deficiency.        Patient is followed expectantly for labile HTN (2015)  & BP has been controlled at home. Today's BP is at goal -  110/76. Patient has had no complaints of any cardiac type chest pain, palpitations, dyspnea / orthopnea / PND, dizziness, claudication, or dependent edema.       Hyperlipidemia is controlled with diet & meds. Patient denies myalgias or other med SE's. Last Lipids were at goal:  Lab Results  Component Value Date   CHOL 115 02/20/2020   HDL 39 (L) 02/20/2020   LDLCALC 54 02/20/2020   TRIG 141 02/20/2020   CHOLHDL 2.9 02/20/2020     Also, the patient has history of glucose intolerance and has had no symptoms of reactive hypoglycemia, diabetic polys, paresthesias or visual blurring.  Last A1c was normal & at goal:  Lab Results  Component Value Date   HGBA1C 5.4 02/20/2020        Further, the patient also has history of Vitamin D Deficiency and supplements vitamin D without any suspected side-effects. Last vitamin D was at goal:  Lab Results  Component Value Date   VD25OH 78 02/20/2020     Current Outpatient Medications on File Prior to Visit  Medication Sig   VITAMIN D 1000 units tablet Take daily.   FLAXSEED OIL Take 1,400 mg 2 times daily.   fluticasone (FLONASE) 50 MCG/ACT nasal spray Place into both nostrils as needed for allergies or rhinitis.   meloxicam (MOBIC) 15 MG tablet Take 1/2 to 1 tablet Daily with Food for Pain & Inflammation & try limit to 5 days /week to Avoid Kidney damage   Omega-3 Fatty Acids (FISH OIL) 1200 MG CAPS Take by mouth 3 (three) times daily.   OVER THE COUNTER MEDICATION Takes allergy tablets PRN    pantoprazole (PROTONIX) 40 MG tablet Take  1 tablet  Daily  to prevent Heartburn & Indigestion   rosuvastatin (CRESTOR) 20 MG tablet TAKE 1 TABLET ONCE DAILY FOR CHOLESTEROL   terazosin (HYTRIN) 10 MG capsule TAKE 1 CAPSULE  AT BEDTIME FOR PROSTATE   testosterone cypionate (DEPOTESTOSTERONE CYPIONATE) 200 MG/ML injection Inject 1 ml into Muscle every Week     (Dx:  e11.1)    No Known Allergies   PMHx:  No past medical history on file.    History reviewed. No pertinent surgical history.  FHx:    Reviewed / unchanged  SHx:    Reviewed / unchanged   Systems Review:  Constitutional: Denies fever, chills, wt changes, headaches, insomnia, fatigue, night sweats, change in appetite. Eyes: Denies redness, blurred vision, diplopia, discharge, itchy, watery eyes.  ENT: Denies discharge, congestion, post nasal drip, epistaxis, sore throat, earache, hearing loss, dental pain, tinnitus, vertigo, sinus pain, snoring.  CV: Denies chest pain, palpitations, irregular heartbeat, syncope, dyspnea, diaphoresis, orthopnea, PND, claudication or edema. Respiratory: denies cough, dyspnea, DOE, pleurisy, hoarseness, laryngitis, wheezing.  Gastrointestinal: Denies dysphagia, odynophagia, heartburn, reflux, water brash, abdominal pain or cramps, nausea, vomiting, bloating, diarrhea, constipation, hematemesis, melena, hematochezia  or hemorrhoids. Genitourinary: Denies dysuria, frequency, urgency, nocturia, hesitancy, discharge, hematuria or flank pain. Musculoskeletal: Denies arthralgias, myalgias, stiffness,  jt. swelling, pain, limping or strain/sprain.  Skin: Denies pruritus, rash, hives, warts, acne, eczema or change in skin lesion(s). Neuro: No weakness, tremor, incoordination, spasms, paresthesia or pain. Psychiatric: Denies confusion, memory loss or sensory loss. Endo: Denies change in weight, skin or hair change.  Heme/Lymph: No excessive bleeding, bruising or enlarged lymph nodes.  Physical Exam  BP  110/76   Pulse 68   Temp 97.7 F (36.5 C)   Resp 17   Ht 5\' 9"  (1.753 m)   Wt 180 lb 6.4 oz (81.8 kg)   SpO2 98%   BMI 26.64 kg/m   Appears  well nourished, well groomed  and in no distress.  Eyes: PERRLA, EOMs, conjunctiva no swelling or erythema. Sinuses: No frontal/maxillary tenderness ENT/Mouth: EAC's clear, TM's nl w/o erythema, bulging. Nares clear w/o erythema, swelling, exudates. Oropharynx clear without erythema or exudates. Oral hygiene is good. Tongue normal, non obstructing. Hearing intact.  Neck: Supple. Thyroid not palpable. Car 2+/2+ without bruits, nodes or JVD. Chest: Respirations nl with BS clear & equal w/o rales, rhonchi, wheezing or stridor.  Cor: Heart sounds normal w/ regular rate and rhythm without sig. murmurs, gallops, clicks or rubs. Peripheral pulses normal and equal  without edema.  Abdomen: Soft & bowel sounds normal. Non-tender w/o guarding, rebound, hernias, masses or organomegaly.  Lymphatics: Unremarkable.  Musculoskeletal: Full ROM all peripheral extremities, joint stability, 5/5 strength and normal gait.  Skin: Warm, dry without exposed rashes, lesions or ecchymosis apparent.  Neuro: Cranial nerves intact, reflexes equal bilaterally. Sensory-motor testing grossly intact. Tendon reflexes grossly intact.  Pysch: Alert & oriented x 3.  Insight and judgement nl & appropriate. No ideations.  Assessment and Plan:  1. Labile hypertension  - Continue medication, monitor blood pressure at home.  - Continue DASH diet.  Reminder to go to the ER if any CP,  SOB, nausea, dizziness, severe HA, changes vision/speech.   - CBC with Differential/Platelet - COMPLETE METABOLIC PANEL WITH GFR - Magnesium  2. Hyperlipidemia, mixed  - Continue diet/meds, exercise,& lifestyle modifications.  - Continue monitor periodic cholesterol/liver & renal functions    - Lipid panel - TSH  3. Abnormal glucose  - Insulin, random  - Continue diet, exercise  -  Lifestyle modifications.  - Monitor appropriate labs   4. Vitamin D deficiency  - Continue supplementation   - VITAMIN D 25 Hydroxy  5. Testosterone deficiency  - Testosterone  6. Medication management  - CBC with Differential/Platelet - COMPLETE METABOLIC PANEL WITH GFR - Magnesium - Lipid panel - TSH - Hemoglobin A1c - Insulin, random - VITAMIN D 25 Hydroxy - Testosterone          Discussed  regular exercise, BP monitoring, weight control to achieve/maintain BMI less than 25 and discussed med and SE's. Recommended labs to assess and monitor clinical status with further disposition pending results of labs.  I discussed the assessment and treatment plan with the patient. The patient was provided an opportunity to ask questions and all were answered. The patient agreed with the plan and demonstrated an understanding of the instructions.  I provided over 30 minutes of exam, counseling, chart review and  complex critical decision making.        The patient was advised to call back or seek an in-person evaluation if the symptoms worsen or if the condition fails to improve as anticipated.   Kirtland Bouchard, MD

## 2020-10-03 LAB — CBC WITH DIFFERENTIAL/PLATELET
Absolute Monocytes: 599 cells/uL (ref 200–950)
Basophils Absolute: 38 cells/uL (ref 0–200)
Basophils Relative: 0.6 %
Eosinophils Absolute: 183 cells/uL (ref 15–500)
Eosinophils Relative: 2.9 %
HCT: 45.7 % (ref 38.5–50.0)
Hemoglobin: 15.2 g/dL (ref 13.2–17.1)
Lymphs Abs: 1657 cells/uL (ref 850–3900)
MCH: 31.7 pg (ref 27.0–33.0)
MCHC: 33.3 g/dL (ref 32.0–36.0)
MCV: 95.4 fL (ref 80.0–100.0)
MPV: 9.6 fL (ref 7.5–12.5)
Monocytes Relative: 9.5 %
Neutro Abs: 3824 cells/uL (ref 1500–7800)
Neutrophils Relative %: 60.7 %
Platelets: 280 10*3/uL (ref 140–400)
RBC: 4.79 10*6/uL (ref 4.20–5.80)
RDW: 11.9 % (ref 11.0–15.0)
Total Lymphocyte: 26.3 %
WBC: 6.3 10*3/uL (ref 3.8–10.8)

## 2020-10-03 LAB — COMPLETE METABOLIC PANEL WITH GFR
AG Ratio: 2 (calc) (ref 1.0–2.5)
ALT: 13 U/L (ref 9–46)
AST: 17 U/L (ref 10–35)
Albumin: 4.3 g/dL (ref 3.6–5.1)
Alkaline phosphatase (APISO): 51 U/L (ref 35–144)
BUN: 11 mg/dL (ref 7–25)
CO2: 29 mmol/L (ref 20–32)
Calcium: 9.5 mg/dL (ref 8.6–10.3)
Chloride: 103 mmol/L (ref 98–110)
Creat: 1.1 mg/dL (ref 0.70–1.25)
GFR, Est African American: 80 mL/min/{1.73_m2} (ref >=60)
GFR, Est Non African American: 69 mL/min/{1.73_m2} (ref >=60)
Globulin: 2.2 g/dL (calc) (ref 1.9–3.7)
Glucose, Bld: 90 mg/dL (ref 65–99)
Potassium: 4.9 mmol/L (ref 3.5–5.3)
Sodium: 139 mmol/L (ref 135–146)
Total Bilirubin: 0.9 mg/dL (ref 0.2–1.2)
Total Protein: 6.5 g/dL (ref 6.1–8.1)

## 2020-10-03 LAB — LIPID PANEL
Cholesterol: 113 mg/dL (ref <200)
HDL: 35 mg/dL — ABNORMAL LOW (ref >=40)
LDL Cholesterol (Calc): 57 mg/dL (calc)
Non-HDL Cholesterol (Calc): 78 mg/dL (calc) (ref <130)
Total CHOL/HDL Ratio: 3.2 (calc) (ref <5.0)
Triglycerides: 131 mg/dL (ref <150)

## 2020-10-03 LAB — TESTOSTERONE: Testosterone: 1733 ng/dL — ABNORMAL HIGH (ref 250–827)

## 2020-10-03 LAB — VITAMIN D 25 HYDROXY (VIT D DEFICIENCY, FRACTURES): Vit D, 25-Hydroxy: 75 ng/mL (ref 30–100)

## 2020-10-03 LAB — HEMOGLOBIN A1C
Hgb A1c MFr Bld: 5.3 % of total Hgb (ref <5.7)
Mean Plasma Glucose: 105 mg/dL
eAG (mmol/L): 5.8 mmol/L

## 2020-10-03 LAB — INSULIN, RANDOM: Insulin: 3.1 u[IU]/mL

## 2020-10-03 LAB — TSH: TSH: 2.61 mIU/L (ref 0.40–4.50)

## 2020-10-03 LAB — MAGNESIUM: Magnesium: 1.9 mg/dL (ref 1.5–2.5)

## 2020-10-03 NOTE — Progress Notes (Signed)
============================================================ ============================================================  -    Total Chol = 113   and LDL Chol 57 - Both    Excellent    - Please Keep Rosuvastatin  ( Crestor ) dose same  ============================================================ ============================================================  - A1c - Normal - great - No Diabetes ! ============================================================ ============================================================  - Vitamin D = 75 - Excellent  ! ============================================================ ============================================================  - Testosterone level is very high - be sure only taking 1 ml or 1 cc every 7 days ============================================================ ============================================================  - All Else - CBC - Kidneys - Electrolytes - Liver - Magnesium & Thyroid    - all  Normal / OK ============================================================ ============================================================  - Keep up the [place] Work  ! ============================================================ ============================================================                          -    ========================================================  -

## 2020-10-04 NOTE — Progress Notes (Signed)
Pt was called to discuss his lab results. Pt did not answer the phone. A VM was left for th pt to call the office back. 10/04/20

## 2020-11-25 ENCOUNTER — Other Ambulatory Visit: Payer: Self-pay | Admitting: Adult Health

## 2021-02-09 ENCOUNTER — Other Ambulatory Visit: Payer: Self-pay | Admitting: Internal Medicine

## 2021-02-09 DIAGNOSIS — G8921 Chronic pain due to trauma: Secondary | ICD-10-CM

## 2021-03-04 ENCOUNTER — Telehealth: Payer: Self-pay | Admitting: Internal Medicine

## 2021-03-04 NOTE — Progress Notes (Signed)
  Chronic Care Management   Outreach Note  03/04/2021 Name: Christian Dixon MRN: [id number] DOB: 04-May-1950  Referred by: [name], MD Reason for referral : No chief complaint on file.   An unsuccessful telephone outreach was attempted today. The patient was referred to the pharmacist for assistance with care management and care coordination.   Follow Up Plan:   Tatjana Dellinger Upstream Scheduler

## 2021-03-06 ENCOUNTER — Ambulatory Visit (INDEPENDENT_AMBULATORY_CARE_PROVIDER_SITE_OTHER): Payer: PPO | Admitting: Internal Medicine

## 2021-03-06 ENCOUNTER — Encounter: Payer: Self-pay | Admitting: Internal Medicine

## 2021-03-06 ENCOUNTER — Other Ambulatory Visit: Payer: Self-pay

## 2021-03-06 VITALS — BP 134/78 | HR 86 | Temp 97.9°F | Resp 16 | Ht 69.0 in | Wt 173.0 lb

## 2021-03-06 DIAGNOSIS — Z136 Encounter for screening for cardiovascular disorders: Secondary | ICD-10-CM | POA: Diagnosis not present

## 2021-03-06 DIAGNOSIS — K219 Gastro-esophageal reflux disease without esophagitis: Secondary | ICD-10-CM

## 2021-03-06 DIAGNOSIS — Z1212 Encounter for screening for malignant neoplasm of rectum: Secondary | ICD-10-CM

## 2021-03-06 DIAGNOSIS — I444 Left anterior fascicular block: Secondary | ICD-10-CM

## 2021-03-06 DIAGNOSIS — Z8249 Family history of ischemic heart disease and other diseases of the circulatory system: Secondary | ICD-10-CM | POA: Diagnosis not present

## 2021-03-06 DIAGNOSIS — Z87891 Personal history of nicotine dependence: Secondary | ICD-10-CM | POA: Diagnosis not present

## 2021-03-06 DIAGNOSIS — Z125 Encounter for screening for malignant neoplasm of prostate: Secondary | ICD-10-CM

## 2021-03-06 DIAGNOSIS — Z0000 Encounter for general adult medical examination without abnormal findings: Secondary | ICD-10-CM

## 2021-03-06 DIAGNOSIS — N401 Enlarged prostate with lower urinary tract symptoms: Secondary | ICD-10-CM

## 2021-03-06 DIAGNOSIS — Z79899 Other long term (current) drug therapy: Secondary | ICD-10-CM | POA: Diagnosis not present

## 2021-03-06 DIAGNOSIS — N138 Other obstructive and reflux uropathy: Secondary | ICD-10-CM | POA: Diagnosis not present

## 2021-03-06 DIAGNOSIS — R7309 Other abnormal glucose: Secondary | ICD-10-CM

## 2021-03-06 DIAGNOSIS — R0989 Other specified symptoms and signs involving the circulatory and respiratory systems: Secondary | ICD-10-CM | POA: Diagnosis not present

## 2021-03-06 DIAGNOSIS — E559 Vitamin D deficiency, unspecified: Secondary | ICD-10-CM | POA: Diagnosis not present

## 2021-03-06 DIAGNOSIS — N182 Chronic kidney disease, stage 2 (mild): Secondary | ICD-10-CM

## 2021-03-06 DIAGNOSIS — Z0001 Encounter for general adult medical examination with abnormal findings: Secondary | ICD-10-CM

## 2021-03-06 DIAGNOSIS — Z1211 Encounter for screening for malignant neoplasm of colon: Secondary | ICD-10-CM

## 2021-03-06 DIAGNOSIS — E349 Endocrine disorder, unspecified: Secondary | ICD-10-CM

## 2021-03-06 DIAGNOSIS — E782 Mixed hyperlipidemia: Secondary | ICD-10-CM

## 2021-03-06 MED ORDER — "BD LUER-LOK SYRINGE 21G X 1"" 3 ML MISC": 1 refills | Status: DC

## 2021-03-06 NOTE — Patient Instructions (Signed)

## 2021-03-06 NOTE — Progress Notes (Signed)
Annual  Screening/Preventative Visit  & Comprehensive Evaluation & Examination  Future Appointments  Date Time Provider Department  03/06/2021 11:00 AM [name], MD [facility]            This very nice 70 y.o. MWM presents for a Screening /Preventative Visit & comprehensive evaluation and management of multiple medical co-morbidities.  Patient has been followed for labile HTN, HLD, Prediabetes, Testosterone Deficiency  and Vitamin D Deficiency. Patient has hx/o GERD controlled on his meds.       Labile HTN predates since  2015 .   Patient's BP has been controlled at home.  Today's BP is at goal - 134/78. Patient denies any cardiac symptoms as chest pain, palpitations, shortness of breath, dizziness or ankle swelling.       Patient's hyperlipidemia is controlled with diet and Crestor. Patient denies myalgias or other medication SE's. Last lipids were  at goal :  Lab Results  Component Value Date   CHOL 113 10/02/2020   HDL 35 (L) 10/02/2020   LDLCALC 57 10/02/2020   TRIG 131 10/02/2020   CHOLHDL 3.2 10/02/2020                                            Patient is on Parenteral Testosterone replacement every 10 days (last dose was 10 days ago) and he relates on therapy he has an improved sense of well being.       Patient has hx/o abn glucose  and patient denies reactive hypoglycemic symptoms, visual blurring, diabetic polys or paresthesias. Last A1c was normal & at goal :   Lab Results  Component Value Date   HGBA1C 5.3 10/02/2020          Finally, patient has history of Vitamin D Deficiency    and last vitamin D was at goal :   Lab Results  Component Value Date   VD25OH 37 10/02/2020     Current Outpatient Medications on File Prior to Visit  Medication Sig   VITAMIN D 1000 units t Take 1,000 Units  daily.   FLAXSEED OIL 1,400 mg  Take  2 times daily.   FLONASE nasal spray Place into both nostrils as needed for allergies or rhinitis.   meloxicam ( 15 MG  tablet Take  1/2 to 1 tablet  Daily     Omega-3 FISH OIL 1200 MG  Take 3  times daily.   OTC Allergy tablets  Takes daily PRN   pantoprazole  40 MG tablet Take  1 tablet  Daily     rosuvastatin  20 MG tablet Take  1 tablet  Daily    terazosin 10 MG capsule TAKE 1 CAPSULE  AT BEDTIME    testosterone cypio 200 MG/ML injec Inject 1 ml into Muscle every Week     (Dx:  e11.1)    No Known Allergies   No past medical history on file.   Health Maintenance  Topic Date Due   COVID-19 Vaccine (1) Never done   Pneumonia Vaccine 26+ Years old (1 - PCV) Never done   Zoster Vaccines- Shingrix (1 of 2) Never done   Fecal DNA (Cologuard)  10/28/2020   INFLUENZA VACCINE  Never done   TETANUS/TDAP  10/19/2021 (Originally [birth year]29/1971)   Hepatitis C Screening  Completed   HPV VACCINES  Aged Out    There is no immunization history on  file for this patient. -  Patient refuses any  & all Vaccinations  Last Colon - Patient refuses   He did agree to  [place] - 10/28/2017 Negative & 3 year f/u due Aug 2023   Social History   Tobacco Use   Smoking status: Former   Smokeless tobacco: Never  Substance Use Topics   Alcohol use: No      ROS Constitutional: Denies fever, chills, weight loss/gain, headaches, insomnia,  night sweats or change in appetite. Does c/o fatigue. Eyes: Denies redness, blurred vision, diplopia, discharge, itchy or watery eyes.  ENT: Denies discharge, congestion, post nasal drip, epistaxis, sore throat, earache, hearing loss, dental pain, Tinnitus, Vertigo, Sinus pain or snoring.  Cardio: Denies chest pain, palpitations, irregular heartbeat, syncope, dyspnea, diaphoresis, orthopnea, PND, claudication or edema Respiratory: denies cough, dyspnea, DOE, pleurisy, hoarseness, laryngitis or wheezing.  Gastrointestinal: Denies dysphagia, heartburn, reflux, water brash, pain, cramps, nausea, vomiting, bloating, diarrhea, constipation, hematemesis, melena, hematochezia, jaundice or  hemorrhoids Genitourinary: Denies dysuria, frequency, urgency, nocturia, hesitancy, discharge, hematuria or flank pain Musculoskeletal: Denies arthralgia, myalgia, stiffness, Jt. Swelling, pain, limp or strain/sprain. Denies Falls. Skin: Denies puritis, rash, hives, warts, acne, eczema or change in skin lesion Neuro: No weakness, tremor, incoordination, spasms, paresthesia or pain Psychiatric: Denies confusion, memory loss or sensory loss. Denies Depression. Endocrine: Denies change in weight, skin, hair change, nocturia, and paresthesia, diabetic polys, visual blurring or hyper / hypo glycemic episodes.  Heme/Lymph: No excessive bleeding, bruising or enlarged lymph nodes.   Physical Exam  BP 134/78   Pulse 86   Temp 97.9 F (36.6 C)   Resp 16   Ht 5\' 9"  (1.753 m)   Wt 173 lb (78.5 kg)   SpO2 97%   BMI 25.55 kg/m   General Appearance: Well nourished and well groomed and in no apparent distress.  Eyes: PERRLA, EOMs, conjunctiva no swelling or erythema, normal fundi and vessels. Sinuses: No frontal/maxillary tenderness ENT/Mouth: EACs patent / TMs  nl. Nares clear without erythema, swelling, mucoid exudates. Oral hygiene is good. No erythema, swelling, or exudate. Tongue normal, non-obstructing. Tonsils not swollen or erythematous. Hearing normal.  Neck: Supple, thyroid not palpable. No bruits, nodes or JVD. Respiratory: Respiratory effort normal.  BS equal and clear bilateral without rales, rhonci, wheezing or stridor. Cardio: Heart sounds are normal with regular rate and rhythm and no murmurs, rubs or gallops. Peripheral pulses are normal and equal bilaterally without edema. No aortic or femoral bruits. Chest: symmetric with normal excursions and percussion.  Abdomen: Soft, with Nl bowel sounds. Nontender, no guarding, rebound, hernias, masses, or organomegaly.  Lymphatics: Non tender without lymphadenopathy.  Musculoskeletal: Full ROM all peripheral extremities, joint stability,  5/5 strength, and normal gait. Skin: Warm and dry without rashes, lesions, cyanosis, clubbing or  ecchymosis.  Neuro: Cranial nerves intact, reflexes equal bilaterally. Normal muscle tone, no cerebellar symptoms. Sensation intact.  Pysch: Alert and oriented X 3 with normal affect, insight and judgment appropriate.   Assessment and Plan  1. Annual Preventative/Screening Exam  1. Encounter for general adult medical examination with abnormal findings   2. Labile hypertension  - EKG 12-Lead - [place], RETROPERITNL ABD,  LTD - Urinalysis, Routine w reflex microscopic - Microalbumin / creatinine urine ratio - CBC with Differential/Platelet - COMPLETE METABOLIC PANEL WITH GFR - Magnesium - TSH  3. Hyperlipidemia, mixed  - EKG 12-Lead - [place], RETROPERITNL ABD,  LTD - Lipid panel - TSH  4. Abnormal glucose  - EKG 12-Lead - [place], RETROPERITNL  ABD,  LTD - Hemoglobin A1c - Insulin, random  5. Vitamin D deficiency  - VITAMIN D 25 Hydroxy (Vit-D Deficiency, Fractures)  6. Testosterone deficiency  - Testosterone  7. BPH with obstruction/lower urinary tract symptoms  - PSA  8. Prostate cancer screening  - PSA  9. Screening for colorectal cancer  - Cologuard  10. Gastroesophageal reflux disease without esophagitis  - CBC with Differential/Platelet  11. Stage 2 chronic kidney disease  - COMPLETE METABOLIC PANEL WITH GFR  12. Screening for ischemic heart disease  - EKG 12-Lead  13. FHx: heart disease  - EKG 12-Lead - [place], RETROPERITNL ABD,  LTD  14. Former smoker  - EKG 12-Lead - [place], RETROPERITNL ABD,  LTD  15. Screening for AAA (aortic abdominal aneurysm)  - [place], RETROPERITNL ABD,  LTD  16. Medication management  - Urinalysis, Routine w reflex microscopic - Microalbumin / creatinine urine ratio - Testosterone - CBC with Differential/Platelet - COMPLETE METABOLIC PANEL WITH GFR - Magnesium - Lipid panel - TSH - Hemoglobin A1c - Insulin, random - VITAMIN  D 25 Hydroxy ]  17. Left anterior fascicular block (LAFB)             Patient was counseled in prudent diet, weight control to achieve/maintain BMI less than 25, BP monitoring, regular exercise and medications as discussed.  Discussed med effects and SE's. Routine screening labs and tests as requested with regular follow-up as recommended. Over 40 minutes of exam, counseling, chart review and high complex critical decision making was performed   Kirtland Bouchard, MD

## 2021-03-07 NOTE — Progress Notes (Signed)
===========================================================  ===========================================================   -   PSA - Low - Great  ===========================================================  ===========================================================   - Testosterone is way too high from recent shot                                         - Suggest cut dose in 1/2  (  ie, take 1/2 ml  / week)  ===========================================================  ===========================================================   - Total Chol = 145   & LDL Chol 92   - Both     Excellent   - Very low risk for Heart Attack  / Stroke ============================================================ ============================================================  -  A1c - Normal  - No Diabetes - Great ! ===========================================================  ===========================================================   - Vitamin D = 83  - Excellent ===========================================================  ===========================================================   - All Else - CBC - Kidneys - Electrolytes - Liver - Magnesium & Thyroid    - all  Normal / OK ===========================================================  ===========================================================   - Keep up the [place] Work  !    ===========================================================  ===========================================================

## 2021-03-09 ENCOUNTER — Encounter: Payer: Self-pay | Admitting: Internal Medicine

## 2021-03-09 LAB — CBC WITH DIFFERENTIAL/PLATELET
Absolute Monocytes: 604 cells/uL (ref 200–950)
Basophils Absolute: 31 cells/uL (ref 0–200)
Basophils Relative: 0.5 %
Eosinophils Absolute: 92 cells/uL (ref 15–500)
Eosinophils Relative: 1.5 %
HCT: 47.9 % (ref 38.5–50.0)
Hemoglobin: 16.2 g/dL (ref 13.2–17.1)
Lymphs Abs: 781 cells/uL — ABNORMAL LOW (ref 850–3900)
MCH: 31.7 pg (ref 27.0–33.0)
MCHC: 33.8 g/dL (ref 32.0–36.0)
MCV: 93.7 fL (ref 80.0–100.0)
MPV: 9.7 fL (ref 7.5–12.5)
Monocytes Relative: 9.9 %
Neutro Abs: 4593 cells/uL (ref 1500–7800)
Neutrophils Relative %: 75.3 %
Platelets: 296 10*3/uL (ref 140–400)
RBC: 5.11 10*6/uL (ref 4.20–5.80)
RDW: 12.1 % (ref 11.0–15.0)
Total Lymphocyte: 12.8 %
WBC: 6.1 10*3/uL (ref 3.8–10.8)

## 2021-03-09 LAB — URINALYSIS, ROUTINE W REFLEX MICROSCOPIC
Bacteria, UA: NONE SEEN /HPF
Bilirubin Urine: NEGATIVE
Glucose, UA: NEGATIVE
Hgb urine dipstick: NEGATIVE
Hyaline Cast: NONE SEEN /LPF
Leukocytes,Ua: NEGATIVE
Nitrite: NEGATIVE
RBC / HPF: NONE SEEN /HPF (ref 0–2)
Specific Gravity, Urine: 1.025 (ref 1.001–1.035)
Squamous Epithelial / HPF: NONE SEEN /HPF (ref <=5)
WBC, UA: NONE SEEN /HPF (ref 0–5)
pH: <=5 (ref 5.0–8.0)

## 2021-03-09 LAB — MICROALBUMIN / CREATININE URINE RATIO
Creatinine, Urine: 291 mg/dL (ref 20–320)
Microalb Creat Ratio: 10 mcg/mg creat (ref <30)
Microalb, Ur: 2.9 mg/dL

## 2021-03-09 LAB — TESTOSTERONE: Testosterone: 2006 ng/dL — ABNORMAL HIGH (ref 250–827)

## 2021-03-09 LAB — MICROSCOPIC MESSAGE

## 2021-03-09 LAB — COMPLETE METABOLIC PANEL WITH GFR
AG Ratio: 1.8 (calc) (ref 1.0–2.5)
ALT: 16 U/L (ref 9–46)
AST: 20 U/L (ref 10–35)
Albumin: 4.6 g/dL (ref 3.6–5.1)
Alkaline phosphatase (APISO): 51 U/L (ref 35–144)
BUN: 13 mg/dL (ref 7–25)
CO2: 29 mmol/L (ref 20–32)
Calcium: 9.6 mg/dL (ref 8.6–10.3)
Chloride: 102 mmol/L (ref 98–110)
Creat: 1.21 mg/dL (ref 0.70–1.35)
Globulin: 2.5 g/dL (calc) (ref 1.9–3.7)
Glucose, Bld: 101 mg/dL — ABNORMAL HIGH (ref 65–99)
Potassium: 4.9 mmol/L (ref 3.5–5.3)
Sodium: 139 mmol/L (ref 135–146)
Total Bilirubin: 1.4 mg/dL — ABNORMAL HIGH (ref 0.2–1.2)
Total Protein: 7.1 g/dL (ref 6.1–8.1)
eGFR: 65 mL/min/{1.73_m2} (ref >=60)

## 2021-03-09 LAB — HEMOGLOBIN A1C
Hgb A1c MFr Bld: 5.6 % of total Hgb (ref <5.7)
Mean Plasma Glucose: 114 mg/dL
eAG (mmol/L): 6.3 mmol/L

## 2021-03-09 LAB — MAGNESIUM: Magnesium: 2 mg/dL (ref 1.5–2.5)

## 2021-03-09 LAB — PSA: PSA: 0.44 ng/mL (ref <=4.00)

## 2021-03-09 LAB — TSH: TSH: 2.67 mIU/L (ref 0.40–4.50)

## 2021-03-09 LAB — LIPID PANEL
Cholesterol: 145 mg/dL (ref <200)
HDL: 33 mg/dL — ABNORMAL LOW (ref >=40)
LDL Cholesterol (Calc): 92 mg/dL (calc)
Non-HDL Cholesterol (Calc): 112 mg/dL (calc) (ref <130)
Total CHOL/HDL Ratio: 4.4 (calc) (ref <5.0)
Triglycerides: 107 mg/dL (ref <150)

## 2021-03-09 LAB — VITAMIN D 25 HYDROXY (VIT D DEFICIENCY, FRACTURES): Vit D, 25-Hydroxy: 83 ng/mL (ref 30–100)

## 2021-03-09 LAB — INSULIN, RANDOM: Insulin: 6.5 u[IU]/mL

## 2021-03-14 ENCOUNTER — Other Ambulatory Visit: Payer: Self-pay | Admitting: Internal Medicine

## 2021-03-14 MED ORDER — ESCITALOPRAM OXALATE 20 MG PO TABS: ORAL_TABLET | ORAL | 3 refills | Status: DC

## 2021-03-15 ENCOUNTER — Telehealth: Payer: Self-pay | Admitting: Internal Medicine

## 2021-03-15 NOTE — Chronic Care Management (AMB) (Signed)
°  Chronic Care Management   Outreach Note  03/15/2021 Name: TOMY KHIM MRN: [id number] DOB: 07-29-1950  Referred by: [name], MD Reason for referral : No chief complaint on file.   A second unsuccessful telephone outreach was attempted today. The patient was referred to pharmacist for assistance with care management and care coordination.  Follow Up Plan:   Tatjana Dellinger Upstream Scheduler

## 2021-03-17 DIAGNOSIS — Z1212 Encounter for screening for malignant neoplasm of rectum: Secondary | ICD-10-CM | POA: Diagnosis not present

## 2021-03-17 DIAGNOSIS — Z1211 Encounter for screening for malignant neoplasm of colon: Secondary | ICD-10-CM | POA: Diagnosis not present

## 2021-03-26 ENCOUNTER — Other Ambulatory Visit: Payer: Self-pay | Admitting: Internal Medicine

## 2021-03-26 DIAGNOSIS — R195 Other fecal abnormalities: Secondary | ICD-10-CM

## 2021-03-26 LAB — COLOGUARD: COLOGUARD: POSITIVE — AB

## 2021-03-26 NOTE — Progress Notes (Signed)
============================================================ °============================================================ ° °-    Cologard returned (+) Positive, So . . . .  .  - Referral submitted to GI for colonoscopy  ============================================================ ============================================================

## 2021-03-28 ENCOUNTER — Ambulatory Visit (INDEPENDENT_AMBULATORY_CARE_PROVIDER_SITE_OTHER): Payer: PPO | Admitting: Internal Medicine

## 2021-03-28 ENCOUNTER — Other Ambulatory Visit: Payer: Self-pay

## 2021-03-28 ENCOUNTER — Encounter: Payer: Self-pay | Admitting: Internal Medicine

## 2021-03-28 VITALS — BP 130/76 | HR 67 | Temp 97.9°F | Resp 16 | Ht 69.0 in | Wt 172.2 lb

## 2021-03-28 DIAGNOSIS — J0410 Acute tracheitis without obstruction: Secondary | ICD-10-CM

## 2021-03-28 DIAGNOSIS — J0100 Acute maxillary sinusitis, unspecified: Secondary | ICD-10-CM

## 2021-03-28 MED ORDER — DEXAMETHASONE 4 MG PO TABS: ORAL_TABLET | ORAL | 0 refills | Status: DC

## 2021-03-28 MED ORDER — AZITHROMYCIN 250 MG PO TABS: ORAL_TABLET | ORAL | 1 refills | Status: DC

## 2021-03-28 NOTE — Progress Notes (Signed)
Future Appointments  Date Time Provider Department  03/28/2021  3:30 PM [name], MD [facility]  07/10/2021  2:30 PM [name], NP [facility]    History of Present Illness:         This very nice 70 y.o. MWM followed for labile HTN, HLD, Prediabetes, Testosterone Deficiency, GERD and Vitamin D Deficiency.  Patient just had recent CPE on 03/06/2021 and a Cologard was sent off  & returned  (+) Positive and GI referral was requested .  He presents today to protest having a colonoscopy , but after discussion finally concedes to proceeding with GI referral .        He also presents with c/o  sinus pressure & congestion.    Cologard  -   10/28/2017   -  Negative - recc 3 year      Cologard  -    03/17/2021    -   POSITIVE  Medications    testosterone cypio 200 MG/ML injection, Inject 1 ml into Muscle every Week     rosuvastatin 20 MG tablet, Take  1 tablet  Daily    terazosin - 10 MG capsule, TAKE 1 CAPSULE  A   cholecalciferol (VITAMIN D) 1000 units tablet, Take 1,000 Units by mouth daily.   Flaxseed, Linseed, (FLAXSEED OIL PO), Take 1,400 mg by mouth 2 (two) times daily.   Omega-3 FISH OIL)1200 MG CAPS, Take by mouth 3 (three) times daily.   Takes OTC allergy tablets PRN   pantoprazole  40 MG tablet, Take  1 tablet  Daily  to prevent Heartburn & Indigestion  Problem list He has Labile hypertension; Hyperlipidemia, mixed; Vitamin D deficiency; Testosterone deficiency; Chronic pain due to trauma; BMI 26.0-26.9,adult; Left anterior fascicular block (LAFB); Acid reflux; and CKD (chronic kidney disease) stage 3, GFR 30-59 ml/min (HCC) on their problem list.   Observations/Objective:  BP 130/76    Pulse 67    Temp 97.9 F (36.6 C)    Resp 16    Ht 5\' 9"  (1.753 m)    Wt 172 lb 3.2 oz (78.1 kg)    SpO2 97%    BMI 25.43 kg/m   No cyanosis, icterus, rash, or stridor.  HEENT -    EACs - patent. TMs - clear dull  retracted   pink   Nl color.   N/O/P  -    Clear  injected   No exudates. (+)  Lt > Rt Maxillary area Neck      -    Supple. No sig Cx LN's.    Chest      -    BS equal  with few scattered rales. No  rhonchi  or  wheezes  Cor - Nl HS. RRR w/o sig MGR. PP 1(+). No edema. MS- FROM w/o deformities.  Gait Nl. Neuro -  Nl w/o focal abnormalities.   Assessment and Plan:  1. Subacute maxillary sinusitis  - dexamethasone  4 MG tablet;  Take 1 tab 3 x day - 3 days, then 2 x day - 3 days, then 1 tab daily  Dispense: 20 tablet  - azithromycin  250 MG tablet;  Take 2 tablets with Food on  Day 1, then 1 tablet Daily  l  Dispense: 6 each; Refill: 1  2. Tracheitis  - dexamethasone  4 MG tablet;  Take 1 tab 3 x day - 3 days, then 2 x day - 3 days, then 1 tab daily   Dispense: 20 tablet  -  azithromycin   250 MG tablet;  Take 2 tablets with Food on  Day 1, then 1 tablet Daily    Dispense: 6 each; Refill: 1  Follow Up Instructions:        I discussed the assessment and treatment plan with the patient. The patient was provided an opportunity to ask questions and all were answered. The patient agreed with the plan and demonstrated an understanding of the instructions.       The patient was advised to call back or seek an in-person evaluation if the symptoms worsen or if the condition fails to improve as anticipated.    Kirtland Bouchard, MD

## 2021-04-04 ENCOUNTER — Encounter: Payer: Self-pay | Admitting: Gastroenterology

## 2021-05-09 ENCOUNTER — Ambulatory Visit (AMBULATORY_SURGERY_CENTER): Payer: PPO | Admitting: *Deleted

## 2021-05-09 ENCOUNTER — Other Ambulatory Visit: Payer: Self-pay

## 2021-05-09 VITALS — Ht 69.0 in | Wt 170.0 lb

## 2021-05-09 DIAGNOSIS — Z1211 Encounter for screening for malignant neoplasm of colon: Secondary | ICD-10-CM

## 2021-05-09 MED ORDER — PEG 3350-KCL-NA BICARB-NACL 420 G PO SOLR: 4000.0000 mL | Freq: Once | ORAL | 0 refills | Status: AC

## 2021-05-09 NOTE — Progress Notes (Signed)
No egg or soy allergy known to patient  No issues known to pt with past sedation with any surgeries or procedures Patient denies ever being told they had issues or difficulty with intubation  No FH of Malignant Hyperthermia Pt is not on diet pills Pt is not on  home 02  Pt is not on blood thinners  Pt denies issues with constipation  No A fib or A flutter  Pt is not  vaccinated  for Covid   NO PA's for preps discussed with pt In PV today  Discussed with pt there will be an out-of-pocket cost for prep and that varies from $0 to 70 +  dollars - pt verbalized understanding   Due to the COVID-19 pandemic we are asking patients to follow certain guidelines in PV and the [place]   Pt aware of COVID protocols and [facility] guidelines   PV completed over the phone. Pt verified name, DOB, address and insurance during PV today.  Pt mailed instruction packet with copy of consent form to read and not return, and instructions.  Pt encouraged to call with questions or issues.  If pt has My chart, procedure instructions sent via My Chart

## 2021-05-10 ENCOUNTER — Other Ambulatory Visit: Payer: Self-pay | Admitting: Internal Medicine

## 2021-05-10 ENCOUNTER — Other Ambulatory Visit: Payer: Self-pay | Admitting: Adult Health

## 2021-05-10 DIAGNOSIS — N401 Enlarged prostate with lower urinary tract symptoms: Secondary | ICD-10-CM

## 2021-05-10 DIAGNOSIS — N138 Other obstructive and reflux uropathy: Secondary | ICD-10-CM

## 2021-05-12 ENCOUNTER — Other Ambulatory Visit: Payer: Self-pay | Admitting: Internal Medicine

## 2021-05-12 DIAGNOSIS — E349 Endocrine disorder, unspecified: Secondary | ICD-10-CM

## 2021-05-20 ENCOUNTER — Encounter: Payer: Self-pay | Admitting: Gastroenterology

## 2021-05-23 ENCOUNTER — Telehealth: Payer: Self-pay | Admitting: *Deleted

## 2021-05-23 ENCOUNTER — Encounter: Payer: PPO | Admitting: Gastroenterology

## 2021-05-23 NOTE — Telephone Encounter (Signed)
Pt's wife called stating pt is unable to tolerate bowel prep (Golytely) for colonoscopy. He vomited with PM dose. He did have BM's after the dose but wife reports they are still brown, semi-liquid and unable to see to the bottom of the toilet. They called and spoke with the on call MD last night and recommendations given to take Phenergan with the prep and continue to try and complete it. Pt attempted to drink the prep this morning and vomited immediately and has not had much BM since and it is still brown. MD made aware. Procedure to be cancelled for today and rescheduled. Dr. Loletha Carrow to review pt's chart and insurance to decide on a different prep/plan for pt to have colonoscopy.   Called pt's wife back to discuss plan and she verbalized understanding. She reports that pt had violent vomiting with the prep and is very intolerant to sodium. They did not reschedule at this time as pt's wife said it was very difficult to convince the pt schedule the procedure in the first place. States his PCP had to "yell" at him to get him to agree to the colonoscopy. Once the decision is made on the prep, the wife asked that the office give them a call to reschedule as she will have had time to try and convince the pt to try again.

## 2021-05-24 MED ORDER — METOCLOPRAMIDE HCL 5 MG PO TABS: ORAL_TABLET | ORAL | 0 refills | Status: DC

## 2021-05-24 MED ORDER — PLENVU 140 G PO SOLR: 1.0000 | ORAL | 0 refills | Status: DC

## 2021-05-24 NOTE — Telephone Encounter (Signed)
Called and spoke with patient regarding information below. Pt states that he is willing to reschedule but would like to wait a couple of months. His colonoscopy has been rescheduled to Wednesday, 07/13/21 at 11 am. Pt is aware that he will need to arrive at 10 am with a care partner. Pt knows that I will be sending in an RX for Plenvu and Reglan for him to take prior to his prep. Pt has been advised to pick up prep and Reglan this week since the pharmacy may put it back. He knows that I will mail him new instructions. Patient verbalized understanding and had no concerns at the end of the call.

## 2021-05-24 NOTE — Telephone Encounter (Signed)
Christian Dixon,  Please see the attached note from our endoscopy nurse yesterday.  I understand he had a difficult time with the preparation, but I strongly recommend he consider having a repeat attempted preparation and colonoscopy because he had a positive Cologuard test 2 months ago.  That indicates he most likely has precancerous colon polyps or (though less likely) the possibility of colon cancer.  My recommendations for repeat bowel preparation:  Metoclopramide 5 mg 1 hour before starting evening dose of bowel preparation and another dose an hour before morning dose of bowel prep  Plenvu prep (he should be aware Medicare will likely not cover it and therefore will be a cash pay, but he was intolerant of GoLytely)   sample can be given if available.  HD

## 2021-06-19 NOTE — Telephone Encounter (Signed)
metoCLOPramide (REGLAN) 5 MG tablet 2 tablet 0 05/24/2021    ?Sig: Take 1 tablet (5 mg total) 1 hour before starting evening dose of bowel preparation, take 1 tablet (5 mg total)an hour before morning dose of bowel prep   ?Sent to pharmacy as: metoCLOPramide (REGLAN) 5 MG tablet   ?E-Prescribing Status: Receipt confirmed by pharmacy (05/24/2021  8:58 AM EST)   ? ?Original RX was sent to pharmacy on 05/24/21, receipt confirmation above. I called the pharmacy and spoke with [facility], she confirmed that they have the RX for Reglan and will get it ready for the patient.  ? ?Lm on vm for patient's wife to return call. ?

## 2021-06-19 NOTE — Telephone Encounter (Signed)
Pt's wife returned call. I informed her that the prescription was at the pharmacy and I asked the pharmacists to go ahead and get the prescription ready for the pt. Pt's wife stated the pt was able to pick up his prescription. Pt's wife had no concerns at the end of the call. ?

## 2021-06-19 NOTE — Telephone Encounter (Signed)
Inbound call from patient wife. States pharmacy did not have the reglan prescription and requesting it be sent again  ?

## 2021-07-10 ENCOUNTER — Ambulatory Visit: Payer: PPO | Admitting: Adult Health

## 2021-07-11 ENCOUNTER — Encounter: Payer: Self-pay | Admitting: Gastroenterology

## 2021-07-11 ENCOUNTER — Ambulatory Visit (AMBULATORY_SURGERY_CENTER): Payer: PPO | Admitting: Gastroenterology

## 2021-07-11 VITALS — BP 140/80 | HR 68 | Temp 97.1°F | Resp 12 | Ht 69.0 in | Wt 170.0 lb

## 2021-07-11 DIAGNOSIS — R195 Other fecal abnormalities: Secondary | ICD-10-CM | POA: Diagnosis not present

## 2021-07-11 DIAGNOSIS — D122 Benign neoplasm of ascending colon: Secondary | ICD-10-CM | POA: Diagnosis not present

## 2021-07-11 DIAGNOSIS — D123 Benign neoplasm of transverse colon: Secondary | ICD-10-CM | POA: Diagnosis not present

## 2021-07-11 DIAGNOSIS — Z1211 Encounter for screening for malignant neoplasm of colon: Secondary | ICD-10-CM | POA: Diagnosis not present

## 2021-07-11 MED ORDER — SODIUM CHLORIDE 0.9 % IV SOLN: 500.0000 mL | Freq: Once | INTRAVENOUS | Status: DC

## 2021-07-11 NOTE — Op Note (Signed)
[place] ?Patient Name: Christian Dixon ?Procedure Date: 07/11/2021 10:43 AM ?MRN: [id number] ?Endoscopist: Estill Cotta. Loletha Carrow , MD ?Age: 71 ?Referring MD:  ?Date of Birth: 1950-04-02 ?Gender: Male ?Account #: [id number] ?Procedure:                Colonoscopy ?Indications:              Positive Cologuard test ?Medicines:                Monitored Anesthesia Care ?Procedure:                Pre-Anesthesia Assessment: ?                          - Prior to the procedure, a History and Physical  ?                          was performed, and patient medications and  ?                          allergies were reviewed. The patient's tolerance of  ?                          previous anesthesia was also reviewed. The risks  ?                          and benefits of the procedure and the sedation  ?                          options and risks were discussed with the patient.  ?                          All questions were answered, and informed consent  ?                          was obtained. Prior Anticoagulants: The patient has  ?                          taken no previous anticoagulant or antiplatelet  ?                          agents. ASA Grade Assessment: II - A patient with  ?                          mild systemic disease. After reviewing the risks  ?                          and benefits, the patient was deemed in  ?                          satisfactory condition to undergo the procedure. ?                          After obtaining informed consent, the colonoscope  ?  was passed under direct vision. Throughout the  ?                          procedure, the patient's blood pressure, pulse, and  ?                          oxygen saturations were monitored continuously. The  ?                          CF [id number] #[id number] was introduced through the anus  ?                          and advanced to the the cecum, identified by  ?                          appendiceal orifice and ileocecal valve.  Passage of  ?                          the scope was performed without difficulty. The  ?                          patient tolerated the procedure well. The quality  ?                          of the bowel preparation was good except for  ?                          scattered fibrous debris that could not be  ?                          completely cleared). The ileocecal valve,  ?                          appendiceal orifice, and rectum were photographed.  ?                          The bowel preparation used was GoLYTELY. ?Scope In: 11:37:29 AM ?Scope Out: 12:21:42 PM ?Scope Withdrawal Time: 0 hours 38 minutes 45 seconds  ?Total Procedure Duration: 0 hours 44 minutes 13 seconds  ?Findings:                 The perianal and digital rectal examinations were  ?                          normal. ?                          Three sessile polyps were found in the ascending  ?                          colon. The polyps were 2 to 8 mm in size. These  ?                          polyps were removed with a cold snare.  Resection  ?                          and retrieval were complete. ?                          A 15 mm polyp was found in the hepatic flexure. The  ?                          polyp was sessile. The polyp was removed with a  ?                          piecemeal technique using a hot and cold snare.  ?                          Resection and retrieval were complete. Area was  ?                          tattooed with an injection of 0.5 mL of Spot  ?                          (carbon black). Challenging polypectomy due to  ?                          polyp location on proximal side of fold at flexure  ?                          as well as active colonic motility. ?                          Repeat examination of right colon under NBI  ?                          performed. ?                          Internal hemorrhoids were found. ?                          The exam was otherwise without abnormality on  ?                           direct and retroflexion views. ?Complications:            No immediate complications. ?Estimated Blood Loss:     Estimated blood loss was minimal. ?Impression:               - Three 2 to 8 mm polyps in the ascending colon,  ?                          removed with a cold snare. Resected and retrieved. ?                          - One 15 mm polyp at the hepatic flexure, removed  ?  piecemeal using a hot snare. Resected and  ?                          retrieved. Tattooed. ?                          - Internal hemorrhoids. ?                          - The examination was otherwise normal on direct  ?                          and retroflexion views. ?Recommendation:           - Patient has a contact number available for  ?                          emergencies. The signs and symptoms of potential  ?                          delayed complications were discussed with the  ?                          patient. Return to normal activities tomorrow.  ?                          Written discharge instructions were provided to the  ?                          patient. ?                          - Resume previous diet. ?                          - Continue present medications. ?                          - Await pathology results. ?                          - Repeat colonoscopy in 1 year for surveillance  ?                          (piecemeal removal right colon sessile polyp). -  ?                          closer attention to pre-procedure dietary  ?                          restrictions for next exam, especially re: fibrous  ?                          foods ?Socrates Cahoon L. Loletha Carrow, MD ?07/11/2021 12:29:31 PM ?This report has been signed electronically. ?

## 2021-07-11 NOTE — Progress Notes (Signed)
History and Physical: ? This patient presents for endoscopic testing for: ?Encounter Diagnosis  ?Name Primary?  ? Positive colorectal cancer screening using Cologuard test Yes  ? ? ?Cologuard positive Dec 2022 ?Patient denies chronic abdominal pain, rectal bleeding, constipation or diarrhea. ? ? ?ROS: ?Patient denies chest pain or shortness of breath ? ? ?Past Medical History: ?Past Medical History:  ?Diagnosis Date  ? Allergy   ? mild in winter with heat  ? GERD (gastroesophageal reflux disease)   ? on meds  ? Hyperlipidemia   ? on meds  ? ? ? ?Past Surgical History: ?Past Surgical History:  ?Procedure Laterality Date  ? left leg surgery    ? plate placed  ? leg fracture Right   ? from accident  ? MULTIPLE TOOTH EXTRACTIONS    ? no sedation  ? TONSILLECTOMY    ? ? ?Allergies: ?No Known Allergies ? ?Outpatient Meds: ?Current Outpatient Medications  ?Medication Sig Dispense Refill  ? cholecalciferol (VITAMIN D) 1000 units tablet Take 1,000 Units by mouth daily.    ? co-enzyme Q-10 30 MG capsule Take 30 mg by mouth 3 (three) times daily.    ? Flaxseed, Linseed, (FLAXSEED OIL PO) Take 1,400 mg by mouth 2 (two) times daily.    ? fluticasone (FLONASE) 50 MCG/ACT nasal spray Place into both nostrils as needed for allergies or rhinitis.    ? meloxicam (MOBIC) 15 MG tablet Take  1/2 to 1 tablet  Daily  with Food for Pain & Inflammation. Try limit to 5 days/week to avoid Kidney Damage                                /                 TAKE  BY MOUTH 90 tablet 3  ? metoCLOPramide (REGLAN) 5 MG tablet Take 1 tablet (5 mg total) 1 hour before starting evening dose of bowel preparation, take 1 tablet (5 mg total)an hour before morning dose of bowel prep 2 tablet 0  ? Omega-3 Fatty Acids (FISH OIL) 1200 MG CAPS Take by mouth 3 (three) times daily.    ? OVER THE COUNTER MEDICATION Takes allergy tablets PRN    ? oxymetazoline (AFRIN) 0.05 % nasal spray Place 1 spray into both nostrils 2 (two) times daily.    ? rosuvastatin (CRESTOR)  20 MG tablet Take  1 tablet  Daily  for Cholesterol / Patient knows to take by mouth 90 tablet 3  ? SYRINGE-NEEDLE, DISP, 3 ML (B-D 3CC LUER-LOK SYR 21GX1") 21G X 1" 3 ML MISC USE INTRAMUSCULARLY EVERY 14 DAYS AS DIRECTED 20 each 1  ? terazosin (HYTRIN) 10 MG capsule TAKE 1 CAPSULE BY MOUTH AT BEDTIME FOR PROSTATE 90 capsule 1  ? testosterone cypionate (DEPOTESTOSTERONE CYPIONATE) 200 MG/ML injection INJECT 1ML INTO THE MUSCLE EVERY WEEK 10 mL 1  ? pantoprazole (PROTONIX) 40 MG tablet TAKE 1 TABLET ONCE DAILY TO PREVENT HEARTBURN & INDIGESTION. 90 tablet 0  ? ?Current Facility-Administered Medications  ?Medication Dose Route Frequency Provider Last Rate Last Admin  ? 0.9 %  sodium chloride infusion  500 mL Intravenous Once Doran Stabler, MD      ? ? ? ? ?___________________________________________________________________ ?Objective  ? ?Exam: ? ?BP (!) 151/90   Pulse 94   Temp (!) 97.1 ?F (36.2 ?C) (Temporal)   Ht '5\' 9"'$  (1.753 m)   Wt 170 lb (77.1  kg)   SpO2 94%   BMI 25.10 kg/m?  ? ?CV: RRR without murmur, S1/S2 ?Resp: clear to auscultation bilaterally, normal RR and effort noted ?GI: soft, no tenderness, with active bowel sounds. ? ? ?Assessment: ?Encounter Diagnosis  ?Name Primary?  ? Positive colorectal cancer screening using Cologuard test Yes  ? ? ? ?Plan: ?Colonoscopy ? The benefits and risks of the planned procedure were described in detail with the patient or (when appropriate) their health care proxy.  Risks were outlined as including, but not limited to, bleeding, infection, perforation, adverse medication reaction leading to cardiac or pulmonary decompensation, pancreatitis (if ERCP).  The limitation of incomplete mucosal visualization was also discussed.  No guarantees or warranties were given. ? ? ? ?The patient is appropriate for an endoscopic procedure in the ambulatory setting. ? ? - Wilfrid Lund, MD ? ? ? ? ?

## 2021-07-11 NOTE — Progress Notes (Signed)
Called to room to assist during endoscopic procedure.  Patient ID and intended procedure confirmed with present staff. Received instructions for my participation in the procedure from the performing physician.  

## 2021-07-11 NOTE — Progress Notes (Signed)
Patient states that his tongue is burning.  Patient can swallow, and breath well.  He can also speak well. No hives, etc. ?

## 2021-07-11 NOTE — Progress Notes (Signed)
Pt non-responsive, VVS, Report to RN  °

## 2021-07-11 NOTE — Patient Instructions (Signed)
You will need another colonoscopy in 1 year.  Read all of the handouts given to you by your recovery room nurse. ? ?YOU HAD AN ENDOSCOPIC PROCEDURE TODAY AT [facility]:   Refer to the procedure report that was given to you for any specific questions about what was found during the examination.  If the procedure report does not answer your questions, please call your gastroenterologist to clarify.  If you requested that your care partner not be given the details of your procedure findings, then the procedure report has been included in a sealed envelope for you to review at your convenience later. ? ?YOU SHOULD EXPECT: Some feelings of bloating in the abdomen. Passage of more gas than usual.  Walking can help get rid of the air that was put into your GI tract during the procedure and reduce the bloating. If you had a lower endoscopy (such as a colonoscopy or flexible sigmoidoscopy) you may notice spotting of blood in your stool or on the toilet paper. If you underwent a bowel prep for your procedure, you may not have a normal bowel movement for a few days. ? ?Please Note:  You might notice some irritation and congestion in your nose or some drainage.  This is from the oxygen used during your procedure.  There is no need for concern and it should clear up in a day or so. ? ?SYMPTOMS TO REPORT IMMEDIATELY: ? ?Following lower endoscopy (colonoscopy or flexible sigmoidoscopy): ? Excessive amounts of blood in the stool ? Significant tenderness or worsening of abdominal pains ? Swelling of the abdomen that is new, acute ? Fever of 100?F or higher ? ? Black, tarry-looking stools ? ?For urgent or emergent issues, a gastroenterologist can be reached at any hour by calling [phone]. ?Do not use MyChart messaging for urgent concerns.  ? ? ?DIET:  We do recommend a small meal at first, but then you may proceed to your regular diet.  Drink plenty of fluids but you should avoid alcoholic beverages for 24  hours. ? ?ACTIVITY:  You should plan to take it easy for the rest of today and you should NOT DRIVE or use heavy machinery until tomorrow (because of the sedation medicines used during the test).   ? ?FOLLOW UP: ?Our staff will call the number listed on your records 48-72 hours following your procedure to check on you and address any questions or concerns that you may have regarding the information given to you following your procedure. If we do not reach you, we will leave a message.  We will attempt to reach you two times.  During this call, we will ask if you have developed any symptoms of COVID 19. If you develop any symptoms (ie: fever, flu-like symptoms, shortness of breath, cough etc.) before then, please call [phone].  If you test positive for Covid 19 in the 2 weeks post procedure, please call and report this information to [place].   ? ?If any biopsies were taken you will be contacted by phone or by letter within the next 1-3 weeks.  Please call us at [phone] if you have not heard about the biopsies in 3 weeks.  ? ? ?SIGNATURES/CONFIDENTIALITY: ?You and/or your care partner have signed paperwork which will be entered into your electronic medical record.  These signatures attest to the fact that that the information above on your After Visit Summary has been reviewed and is understood.  Full responsibility of the confidentiality of this  discharge information lies with you and/or your care-partner.  ?

## 2021-07-11 NOTE — Progress Notes (Signed)
Pt's states no medical or surgical changes since previsit or office visit.Pt's states no medical or surgical changes since previsit or office visit. 

## 2021-07-12 NOTE — Progress Notes (Deleted)
MEDICARE ANNUAL WELLNESS VISIT AND FOLLOW UP ?Assessment:  ? ? ?Medicare preventive visit ? ?Labile hypertension ?Continue medications ?Monitor blood pressure at home; call if consistently over 130/80 ?Continue DASH diet.   ?Reminder to go to the ER if any CP, SOB, nausea, dizziness, severe HA, changes vision/speech, left arm numbness and tingling and jaw pain. ? ?Left anterior fascicular block ?Monitor; EKG at CPE, no rate controlling drugs; refer to cardiology if progresses ? ?Hyperlipidemia, mixed ?Continue medications: rosuvastatin  ?LDL goal <100 ?Continue low cholesterol diet and exercise.  ?Check lipid panel.  ? ?Vitamin D deficiency ?Continue supplementation for goal of 60-100 ?At goal last check; defer vitamin D level ? ?Testosterone deficiency ?continue to monitor, states medication is helping with symptoms of low T.  ? ?Acid reflux ?Well managed on current medications, failed H2i taper attempt  ?Discussed diet, avoiding triggers and other lifestyle changes ? ?Chronic pain due to trauma ?Lower back pain; well managed by chiropractor visits and regular exercise ? ?BMI 25.0-25.9,adult ?Continue to recommend diet heavy in fruits and veggies and low in animal meats, cheeses, and dairy products, appropriate calorie intake ?Discuss exercise recommendations routinely ?Continue to monitor weight at each visit ? ?CKD II ?Increase fluids, avoid NSAIDS, monitor sugars, will monitor ? ? ?Over 30 minutes of exam, counseling, chart review, and critical decision making was performed ? ?Future Appointments  ?Date Time Provider [place]  ?07/15/2021  2:00 PM Danira Nylander, Townsend Roger, NP [facility] None  ?07/16/2022  2:00 PM Analeya Luallen, Townsend Roger, NP [facility] None  ? ? ? ?Plan:  ? ?During the course of the visit the patient was educated and counseled about appropriate screening and preventive services including:  ? ?Pneumococcal vaccine  ?Influenza vaccine ?Prevnar 13 ?Td vaccine ?Screening electrocardiogram ?Colorectal cancer  screening ?Diabetes screening ?Glaucoma screening ?Nutrition counseling  ? ? ?Subjective:  ?Christian Dixon is a 71 y.o. male presents for Annual Medicare Wellness visit and follow up for chronic medication conditions.  ? ?New to our office in 2020, moved from [place].  ? ?he has a diagnosis of reflux which is currently managed by pantopazole 40 mg daily, did famotidine taper last year, ? failed ?he reports symptoms is currently well controlled, and denies breakthrough reflux, burning in chest, hoarseness or cough.   ? ?BMI is There is no height or weight on file to calculate BMI., he has been working on diet and exercise. Martial arts 4 days a week.  ?Wt Readings from Last 3 Encounters:  ?07/11/21 170 lb (77.1 kg)  ?05/09/21 170 lb (77.1 kg)  ?03/28/21 172 lb 3.2 oz (78.1 kg)  ? ?His blood pressure has been controlled at home, today their BP is   ? He does workout. He denies chest pain, shortness of breath, dizziness. ? ? He is on cholesterol medication (rosuvastatin 20 mg daily) and denies myalgias. His cholesterol is not at goal. The cholesterol last visit was:   ?Lab Results  ?Component Value Date  ? CHOL 145 03/06/2021  ? HDL 33 (L) 03/06/2021  ? [place] 92 03/06/2021  ? TRIG 107 03/06/2021  ? CHOLHDL 4.4 03/06/2021  ? ? He has been working on diet and exercise for glucose management, and denies nausea, paresthesia of the feet, polydipsia, polyuria, visual disturbances and vomiting. Last A1C in the office was:  ?Lab Results  ?Component Value Date  ? HGBA1C 5.6 03/06/2021  ? ?Lab Results  ?Component Value Date  ? GFRNONAA 69 10/02/2020  ? ?Patient is on Vitamin D supplement.   ?  Lab Results  ?Component Value Date  ? VD25OH 83 03/06/2021  ?   ?He has a history of testosterone deficiency and is on testosterone replacement, taking 400 mg every 2 weeks, last shot was 8 days ago. He states that the testosterone helps with his energy, libido, muscle mass. ?Lab Results  ?Component Value Date  ? TESTOSTERONE 2,006 (H)  03/06/2021  ? ?He is on hytrin for BP and LUTS; nocturia x 2 most nights ?Lab Results  ?Component Value Date  ? PSA 0.44 03/06/2021  ? PSA 0.45 02/20/2020  ? PSA 0.5 05/12/2018  ? ? ? ?Medication Review: ? ?Current Outpatient Medications (Endocrine & Metabolic):  ?  testosterone cypionate (DEPOTESTOSTERONE CYPIONATE) 200 MG/ML injection, INJECT 1ML INTO THE MUSCLE EVERY WEEK ? ?Current Outpatient Medications (Cardiovascular):  ?  rosuvastatin (CRESTOR) 20 MG tablet, Take  1 tablet  Daily  for Cholesterol / Patient knows to take by mouth ?  terazosin (HYTRIN) 10 MG capsule, TAKE 1 CAPSULE BY MOUTH AT BEDTIME FOR PROSTATE ? ?Current Outpatient Medications (Respiratory):  ?  fluticasone (FLONASE) 50 MCG/ACT nasal spray, Place into both nostrils as needed for allergies or rhinitis. ?  oxymetazoline (AFRIN) 0.05 % nasal spray, Place 1 spray into both nostrils 2 (two) times daily. ? ?Current Outpatient Medications (Analgesics):  ?  meloxicam (MOBIC) 15 MG tablet, Take  1/2 to 1 tablet  Daily  with Food for Pain & Inflammation. Try limit to 5 days/week to avoid Kidney Damage                                /                 TAKE  BY MOUTH ? ? ?Current Outpatient Medications (Other):  ?  cholecalciferol (VITAMIN D) 1000 units tablet, Take 1,000 Units by mouth daily. ?  co-enzyme Q-10 30 MG capsule, Take 30 mg by mouth 3 (three) times daily. ?  Flaxseed, Linseed, (FLAXSEED OIL PO), Take 1,400 mg by mouth 2 (two) times daily. ?  metoCLOPramide (REGLAN) 5 MG tablet, Take 1 tablet (5 mg total) 1 hour before starting evening dose of bowel preparation, take 1 tablet (5 mg total)an hour before morning dose of bowel prep ?  Omega-3 Fatty Acids (FISH OIL) 1200 MG CAPS, Take by mouth 3 (three) times daily. ?  OVER THE COUNTER MEDICATION, Takes allergy tablets PRN ?  pantoprazole (PROTONIX) 40 MG tablet, TAKE 1 TABLET ONCE DAILY TO PREVENT HEARTBURN & INDIGESTION. ?  SYRINGE-NEEDLE, DISP, 3 ML (B-D 3CC LUER-LOK SYR 21GX1") 21G X 1" 3  ML MISC, USE INTRAMUSCULARLY EVERY 14 DAYS AS DIRECTED ? ?Allergies: ?No Known Allergies ? ?Current Problems (verified) ?has Labile hypertension; Hyperlipidemia, mixed; Vitamin D deficiency; Testosterone deficiency; Chronic pain due to trauma; BMI 26.0-26.9,adult; Left anterior fascicular block (LAFB); Acid reflux; and CKD (chronic kidney disease) stage 3, GFR 30-59 ml/min (HCC) on their problem list. ? ?Screening Tests ? ?There is no immunization history on file for this patient. ? ?Preventative care: ?Last colonoscopy: never, ?Last cologuard: 09/2017 neg ?CXR 11/2017, normal, remote former light smoker  ?  ?Prior vaccinations: ?TD or Tdap: has had in last 10 years, declines ?Influenza: declines  ?Pneumococcal: declines ?Prevnar13: declines ?Shingles/Zostavax: declines  ? ?Names of Other Physician/Practitioners you currently use: ?1. [facility] here for primary care ?2. - , eye doctor, last visit remote, has 69 year old glasses, patient  will schedule to follow up  ?3. - , dentist, last visit remote, full dentures ? ?Patient Care Team: ?[name], MD as PCP - General (Internal Medicine) ? ?Surgical: ?He  has a past surgical history that includes Tonsillectomy; leg fracture (Right); left leg surgery; and Multiple tooth extractions. ?Family ?His family history is not on file. ?Social history  ?He reports that he has quit smoking. He has never used smokeless tobacco. He reports current drug use. Drug: Marijuana. He reports that he does not drink alcohol. ? ?MEDICARE WELLNESS OBJECTIVES: ?Physical activity:   ?Cardiac risk factors:   ?Depression/mood screen:   ? ?  02/18/2020  ? 11:14 PM  ?Depression screen PHQ 2/9  ?Decreased Interest 0  ?Down, Depressed, Hopeless 0  ?PHQ - 2 Score 0  ?  ?ADLs:  ?   ? View : No data to display.  ?  ?  ?  ?  ? ?Cognitive Testing ? Alert? Yes  Normal Appearance?Yes ? Oriented to person? Yes  Place? Yes ?  Time? Yes ? Recall of three objects?   Yes ? Can perform simple calculations? Yes ? Displays appropriate judgment?Yes ? Can read the correct time from a watch face?Yes ? ?EOL planning:   ? ? ?Objective:  ? ?There were no vitals filed for this visit.

## 2021-07-15 ENCOUNTER — Telehealth: Payer: Self-pay | Admitting: *Deleted

## 2021-07-15 ENCOUNTER — Ambulatory Visit: Payer: PPO | Admitting: Nurse Practitioner

## 2021-07-15 DIAGNOSIS — K219 Gastro-esophageal reflux disease without esophagitis: Secondary | ICD-10-CM

## 2021-07-15 DIAGNOSIS — E559 Vitamin D deficiency, unspecified: Secondary | ICD-10-CM

## 2021-07-15 DIAGNOSIS — R0989 Other specified symptoms and signs involving the circulatory and respiratory systems: Secondary | ICD-10-CM

## 2021-07-15 DIAGNOSIS — N182 Chronic kidney disease, stage 2 (mild): Secondary | ICD-10-CM

## 2021-07-15 DIAGNOSIS — R7309 Other abnormal glucose: Secondary | ICD-10-CM

## 2021-07-15 DIAGNOSIS — E782 Mixed hyperlipidemia: Secondary | ICD-10-CM

## 2021-07-15 DIAGNOSIS — Z0000 Encounter for general adult medical examination without abnormal findings: Secondary | ICD-10-CM

## 2021-07-15 DIAGNOSIS — E349 Endocrine disorder, unspecified: Secondary | ICD-10-CM

## 2021-07-15 DIAGNOSIS — E663 Overweight: Secondary | ICD-10-CM

## 2021-07-15 DIAGNOSIS — N138 Other obstructive and reflux uropathy: Secondary | ICD-10-CM

## 2021-07-15 DIAGNOSIS — I444 Left anterior fascicular block: Secondary | ICD-10-CM

## 2021-07-15 NOTE — Telephone Encounter (Signed)
First follow up call attempt.  LVM. 

## 2021-07-15 NOTE — Telephone Encounter (Signed)
?  Follow up Call- ? ? ?  07/11/2021  ? 10:34 AM  ?Call back number  ?Post procedure Call Back phone  # [phone]  ?Permission to leave phone message Yes  ?  ? ?Patient questions: ? ?Do you have a fever, pain , or abdominal swelling? No. ?Pain Score  0 * ? ?Have you tolerated food without any problems? Yes.   ? ?Have you been able to return to your normal activities? Yes.   ? ?Do you have any questions about your discharge instructions: ?Diet   No. ?Medications  No. ?Follow up visit  No. ? ?Do you have questions or concerns about your Care? No. ? ?Actions: ?* If pain score is 4 or above: ?No action needed, pain <4. ? ? ?

## 2021-07-16 ENCOUNTER — Encounter: Payer: Self-pay | Admitting: Gastroenterology

## 2021-07-23 ENCOUNTER — Ambulatory Visit: Payer: PPO | Admitting: Nurse Practitioner

## 2021-08-02 ENCOUNTER — Ambulatory Visit: Payer: PPO | Admitting: Nurse Practitioner

## 2021-08-07 ENCOUNTER — Encounter: Payer: Self-pay | Admitting: Nurse Practitioner

## 2021-08-07 ENCOUNTER — Ambulatory Visit (INDEPENDENT_AMBULATORY_CARE_PROVIDER_SITE_OTHER): Payer: PPO | Admitting: Nurse Practitioner

## 2021-08-07 VITALS — BP 122/70 | HR 77 | Temp 97.9°F | Resp 16 | Ht 69.0 in | Wt 171.0 lb

## 2021-08-07 DIAGNOSIS — E559 Vitamin D deficiency, unspecified: Secondary | ICD-10-CM | POA: Diagnosis not present

## 2021-08-07 DIAGNOSIS — I444 Left anterior fascicular block: Secondary | ICD-10-CM | POA: Diagnosis not present

## 2021-08-07 DIAGNOSIS — E349 Endocrine disorder, unspecified: Secondary | ICD-10-CM

## 2021-08-07 DIAGNOSIS — K219 Gastro-esophageal reflux disease without esophagitis: Secondary | ICD-10-CM

## 2021-08-07 DIAGNOSIS — G8929 Other chronic pain: Secondary | ICD-10-CM

## 2021-08-07 DIAGNOSIS — R0989 Other specified symptoms and signs involving the circulatory and respiratory systems: Secondary | ICD-10-CM | POA: Diagnosis not present

## 2021-08-07 DIAGNOSIS — E782 Mixed hyperlipidemia: Secondary | ICD-10-CM | POA: Diagnosis not present

## 2021-08-07 DIAGNOSIS — Z0000 Encounter for general adult medical examination without abnormal findings: Secondary | ICD-10-CM | POA: Diagnosis not present

## 2021-08-07 DIAGNOSIS — G8921 Chronic pain due to trauma: Secondary | ICD-10-CM

## 2021-08-07 DIAGNOSIS — N183 Chronic kidney disease, stage 3 unspecified: Secondary | ICD-10-CM
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
## 2021-08-08 LAB — CBC WITH DIFFERENTIAL/PLATELET
Absolute Monocytes: 461 cells/uL (ref 200–950)
Basophils Absolute: 32 cells/uL (ref 0–200)
Basophils Relative: 0.6 %
Eosinophils Absolute: 122 cells/uL (ref 15–500)
Eosinophils Relative: 2.3 %
HCT: 45.2 % (ref 38.5–50.0)
Hemoglobin: 14.9 g/dL (ref 13.2–17.1)
Lymphs Abs: 1235 cells/uL (ref 850–3900)
MCH: 31.1 pg (ref 27.0–33.0)
MCHC: 33 g/dL (ref 32.0–36.0)
MCV: 94.4 fL (ref 80.0–100.0)
MPV: 9.8 fL (ref 7.5–12.5)
Monocytes Relative: 8.7 %
Neutro Abs: 3450 cells/uL (ref 1500–7800)
Neutrophils Relative %: 65.1 %
Platelets: 279 10*3/uL (ref 140–400)
RBC: 4.79 10*6/uL (ref 4.20–5.80)
RDW: 11.9 % (ref 11.0–15.0)
Total Lymphocyte: 23.3 %
WBC: 5.3 10*3/uL (ref 3.8–10.8)

## 2021-08-08 LAB — VITAMIN D 25 HYDROXY (VIT D DEFICIENCY, FRACTURES): Vit D, 25-Hydroxy: 101 ng/mL — ABNORMAL HIGH (ref 30–100)

## 2021-08-08 LAB — LIPID PANEL
Cholesterol: 152 mg/dL (ref <200)
HDL: 36 mg/dL — ABNORMAL LOW (ref >=40)
LDL Cholesterol (Calc): 98 mg/dL (calc)
Non-HDL Cholesterol (Calc): 116 mg/dL (calc) (ref <130)
Total CHOL/HDL Ratio: 4.2 (calc) (ref <5.0)
Triglycerides: 87 mg/dL (ref <150)

## 2021-08-08 LAB — COMPLETE METABOLIC PANEL WITH GFR
AG Ratio: 1.7 (calc) (ref 1.0–2.5)
ALT: 13 U/L (ref 9–46)
AST: 17 U/L (ref 10–35)
Albumin: 4.4 g/dL (ref 3.6–5.1)
Alkaline phosphatase (APISO): 53 U/L (ref 35–144)
BUN: 11 mg/dL (ref 7–25)
CO2: 27 mmol/L (ref 20–32)
Calcium: 9.4 mg/dL (ref 8.6–10.3)
Chloride: 104 mmol/L (ref 98–110)
Creat: 1.04 mg/dL (ref 0.70–1.35)
Globulin: 2.6 g/dL (calc) (ref 1.9–3.7)
Glucose, Bld: 101 mg/dL — ABNORMAL HIGH (ref 65–99)
Potassium: 4.6 mmol/L (ref 3.5–5.3)
Sodium: 141 mmol/L (ref 135–146)
Total Bilirubin: 1.3 mg/dL — ABNORMAL HIGH (ref 0.2–1.2)
Total Protein: 7 g/dL (ref 6.1–8.1)
eGFR: 78 mL/min/{1.73_m2} (ref >=60)

## 2021-08-08 LAB — TESTOSTERONE: Testosterone: 1551 ng/dL — ABNORMAL HIGH (ref 250–827)

## 2021-08-12 ENCOUNTER — Telehealth: Payer: Self-pay | Admitting: Nurse Practitioner

## 2021-08-12 DIAGNOSIS — M9901 Segmental and somatic dysfunction of cervical region: Secondary | ICD-10-CM | POA: Diagnosis not present

## 2021-08-12 DIAGNOSIS — M9902 Segmental and somatic dysfunction of thoracic region: Secondary | ICD-10-CM | POA: Diagnosis not present

## 2021-08-12 DIAGNOSIS — M5030 Other cervical disc degeneration, unspecified cervical region: Secondary | ICD-10-CM | POA: Diagnosis not present

## 2021-08-12 DIAGNOSIS — M5413 Radiculopathy, cervicothoracic region: Secondary | ICD-10-CM | POA: Diagnosis not present

## 2021-08-12 NOTE — Telephone Encounter (Signed)
Pt called for his lab results and he wasn't exactly sure how much he takes of his Vitamin D to say if he was taking too much or not. He had a test inj on May 4. And also wanted to know if based on his lab results he can start taking creatine.  ?

## 2021-08-21 DIAGNOSIS — M9901 Segmental and somatic dysfunction of cervical region: Secondary | ICD-10-CM | POA: Diagnosis not present

## 2021-08-21 DIAGNOSIS — M5413 Radiculopathy, cervicothoracic region: Secondary | ICD-10-CM | POA: Diagnosis not present

## 2021-08-21 DIAGNOSIS — M5030 Other cervical disc degeneration, unspecified cervical region: Secondary | ICD-10-CM | POA: Diagnosis not present

## 2021-08-21 DIAGNOSIS — M9902 Segmental and somatic dysfunction of thoracic region: Secondary | ICD-10-CM | POA: Diagnosis not present

## 2021-08-27 ENCOUNTER — Telehealth: Payer: Self-pay | Admitting: Nurse Practitioner

## 2021-08-27 NOTE — Telephone Encounter (Signed)
Wife called again said that she spoke to [facility] and they are saying they didn't receive the PW for the PA on test and bc it is past due it needs to be faxed instead of submitted on the portal  [phone]: fax # (comment needs to say urgent)

## 2021-08-28 NOTE — Telephone Encounter (Signed)
Testosterone denied. Patient will need to pay cash using good rx or single care. Patient notified.

## 2021-08-29 DIAGNOSIS — M9901 Segmental and somatic dysfunction of cervical region: Secondary | ICD-10-CM | POA: Diagnosis not present

## 2021-08-29 DIAGNOSIS — M5030 Other cervical disc degeneration, unspecified cervical region: Secondary | ICD-10-CM | POA: Diagnosis not present

## 2021-08-29 DIAGNOSIS — M5413 Radiculopathy, cervicothoracic region: Secondary | ICD-10-CM | POA: Diagnosis not present

## 2021-08-29 DIAGNOSIS — M9902 Segmental and somatic dysfunction of thoracic region: Secondary | ICD-10-CM | POA: Diagnosis not present

## 2021-09-04 DIAGNOSIS — M5413 Radiculopathy, cervicothoracic region: Secondary | ICD-10-CM | POA: Diagnosis not present

## 2021-09-04 DIAGNOSIS — M9902 Segmental and somatic dysfunction of thoracic region: Secondary | ICD-10-CM | POA: Diagnosis not present

## 2021-09-04 DIAGNOSIS — M9901 Segmental and somatic dysfunction of cervical region: Secondary | ICD-10-CM | POA: Diagnosis not present

## 2021-09-04 DIAGNOSIS — M5030 Other cervical disc degeneration, unspecified cervical region: Secondary | ICD-10-CM | POA: Diagnosis not present

## 2021-09-11 DIAGNOSIS — M9901 Segmental and somatic dysfunction of cervical region: Secondary | ICD-10-CM | POA: Diagnosis not present

## 2021-09-11 DIAGNOSIS — M9902 Segmental and somatic dysfunction of thoracic region: Secondary | ICD-10-CM | POA: Diagnosis not present

## 2021-09-11 DIAGNOSIS — M5413 Radiculopathy, cervicothoracic region: Secondary | ICD-10-CM | POA: Diagnosis not present

## 2021-09-11 DIAGNOSIS — M5030 Other cervical disc degeneration, unspecified cervical region: Secondary | ICD-10-CM | POA: Diagnosis not present

## 2021-09-16 ENCOUNTER — Other Ambulatory Visit: Payer: PPO

## 2021-09-20 DIAGNOSIS — M5030 Other cervical disc degeneration, unspecified cervical region: Secondary | ICD-10-CM | POA: Diagnosis not present

## 2021-09-20 DIAGNOSIS — M5413 Radiculopathy, cervicothoracic region: Secondary | ICD-10-CM | POA: Diagnosis not present

## 2021-09-20 DIAGNOSIS — M9902 Segmental and somatic dysfunction of thoracic region: Secondary | ICD-10-CM | POA: Diagnosis not present

## 2021-09-20 DIAGNOSIS — M9901 Segmental and somatic dysfunction of cervical region: Secondary | ICD-10-CM | POA: Diagnosis not present

## 2021-09-25 ENCOUNTER — Telehealth: Payer: Self-pay | Admitting: Nurse Practitioner

## 2021-09-25 NOTE — Telephone Encounter (Signed)
Requesting refill on testosterone injections to [facility] on file

## 2021-09-26 ENCOUNTER — Other Ambulatory Visit: Payer: Self-pay | Admitting: Nurse Practitioner

## 2021-09-26 DIAGNOSIS — E349 Endocrine disorder, unspecified: Secondary | ICD-10-CM

## 2021-09-27 ENCOUNTER — Other Ambulatory Visit: Payer: Self-pay | Admitting: Nurse Practitioner

## 2021-09-27 DIAGNOSIS — E349 Endocrine disorder, unspecified: Secondary | ICD-10-CM

## 2021-09-27 MED ORDER — TESTOSTERONE CYPIONATE 200 MG/ML IM SOLN: INTRAMUSCULAR | 1 refills | Status: DC

## 2021-10-02 ENCOUNTER — Telehealth: Payer: Self-pay | Admitting: Nurse Practitioner

## 2021-10-02 NOTE — Telephone Encounter (Signed)
Please resend Test. Meds to [facility] in [place], it is cheaper there. He is also needing needles to go with it, 21 gauge please

## 2021-10-04 ENCOUNTER — Other Ambulatory Visit: Payer: Self-pay | Admitting: Adult Health

## 2021-10-04 DIAGNOSIS — E349 Endocrine disorder, unspecified: Secondary | ICD-10-CM

## 2021-10-04 DIAGNOSIS — M5030 Other cervical disc degeneration, unspecified cervical region: Secondary | ICD-10-CM | POA: Diagnosis not present

## 2021-10-04 DIAGNOSIS — M9901 Segmental and somatic dysfunction of cervical region: Secondary | ICD-10-CM | POA: Diagnosis not present

## 2021-10-04 DIAGNOSIS — M5413 Radiculopathy, cervicothoracic region: Secondary | ICD-10-CM | POA: Diagnosis not present

## 2021-10-04 DIAGNOSIS — M9902 Segmental and somatic dysfunction of thoracic region: Secondary | ICD-10-CM | POA: Diagnosis not present

## 2021-10-04 MED ORDER — "BD LUER-LOK SYRINGE 21G X 1"" 3 ML MISC": 1 refills | Status: DC

## 2021-10-04 MED ORDER — TESTOSTERONE CYPIONATE 200 MG/ML IM SOLN: INTRAMUSCULAR | 1 refills | Status: DC

## 2021-10-04 NOTE — Progress Notes (Signed)
Future Appointments  Date Time Provider [place]  02/12/2022  3:30 PM [name], MD [facility] None  08/08/2022 11:00 AM Darrol Jump, NP [facility] None    PDMP reviewed for testosterone refill request.

## 2021-10-09 DIAGNOSIS — M5413 Radiculopathy, cervicothoracic region: Secondary | ICD-10-CM | POA: Diagnosis not present

## 2021-10-09 DIAGNOSIS — M9901 Segmental and somatic dysfunction of cervical region: Secondary | ICD-10-CM | POA: Diagnosis not present

## 2021-10-09 DIAGNOSIS — M5030 Other cervical disc degeneration, unspecified cervical region: Secondary | ICD-10-CM | POA: Diagnosis not present

## 2021-10-09 DIAGNOSIS — M9902 Segmental and somatic dysfunction of thoracic region: Secondary | ICD-10-CM | POA: Diagnosis not present

## 2021-10-11 ENCOUNTER — Other Ambulatory Visit: Payer: Self-pay | Admitting: Adult Health

## 2021-10-20 ENCOUNTER — Other Ambulatory Visit: Payer: Self-pay | Admitting: Internal Medicine

## 2021-10-20 DIAGNOSIS — E349 Endocrine disorder, unspecified: Secondary | ICD-10-CM

## 2021-10-20 MED ORDER — TESTOSTERONE CYPIONATE 200 MG/ML IM SOLN: INTRAMUSCULAR | 1 refills | Status: DC

## 2021-11-03 ENCOUNTER — Other Ambulatory Visit: Payer: Self-pay | Admitting: Internal Medicine

## 2021-11-03 DIAGNOSIS — E349 Endocrine disorder, unspecified: Secondary | ICD-10-CM

## 2021-11-03 MED ORDER — "BD LUER-LOK SYRINGE 21G X 1"" 3 ML MISC": 0 refills | Status: AC

## 2021-11-03 MED ORDER — TESTOSTERONE CYPIONATE 200 MG/ML IM SOLN: INTRAMUSCULAR | 1 refills | Status: DC

## 2021-11-04 ENCOUNTER — Telehealth: Payer: Self-pay

## 2021-11-04 NOTE — Telephone Encounter (Signed)
PA Submitted today to CoverMyMeds for Testosterone

## 2021-11-13 ENCOUNTER — Other Ambulatory Visit: Payer: Self-pay

## 2021-11-13 DIAGNOSIS — N138 Other obstructive and reflux uropathy: Secondary | ICD-10-CM

## 2021-11-13 MED ORDER — TERAZOSIN HCL 10 MG PO CAPS: ORAL_CAPSULE | ORAL | 1 refills | Status: DC

## 2021-11-24 ENCOUNTER — Other Ambulatory Visit: Payer: Self-pay | Admitting: Internal Medicine

## 2021-12-31 ENCOUNTER — Other Ambulatory Visit: Payer: Self-pay | Admitting: Internal Medicine

## 2021-12-31 DIAGNOSIS — M25511 Pain in right shoulder: Secondary | ICD-10-CM

## 2022-01-19 ENCOUNTER — Other Ambulatory Visit: Payer: Self-pay | Admitting: Nurse Practitioner

## 2022-02-12 ENCOUNTER — Ambulatory Visit: Payer: PPO | Admitting: Internal Medicine

## 2022-02-13 ENCOUNTER — Ambulatory Visit: Payer: PPO | Admitting: Internal Medicine

## 2022-03-09 ENCOUNTER — Encounter: Payer: Self-pay | Admitting: Internal Medicine

## 2022-03-09 DIAGNOSIS — N182 Chronic kidney disease, stage 2 (mild): Secondary | ICD-10-CM | POA: Insufficient documentation

## 2022-03-09 NOTE — Patient Instructions (Signed)

## 2022-03-09 NOTE — Progress Notes (Unsigned)
L A S T       M  I  N  U  T  E    C A N C E L L  A T  I  O N   OF     C  O  N  F  I  R  M  E D        C  P  E                                                                                                                                                                                                                                                                                                                                                                                                   Annual  Screening/Preventative Visit  & Comprehensive Evaluation & Examination  Future Appointments  Date Time Provider Department  03/10/2022 11:00 AM [name], MD [facility]  08/08/2022 11:00 AM Darrol Jump, NP [facility]           This very nice 71 y.o. MWM with  labile HTN, HLD, Prediabetes, Testosterone Deficiency  and Vitamin D Deficiency who  presents for a Screening /Preventative Visit & comprehensive evaluation and management of multiple medical co-morbidities.   Patient has hx/o GERD controlled on his meds.       Labile HTN predates circa 2015 .   Patient's BP has been controlled at home.  Today's BP is at goal -                              .  Patient has CKD2 (GFR 78) . Patient denies any cardiac symptoms as chest pain, palpitations, shortness of breath, dizziness or ankle swelling.       Patient's hyperlipidemia is controlled with diet and Crestor. Patient denies myalgias or other medication SE's. Last lipids were  at goal :  Lab Results  Component Value Date   CHOL 152 08/07/2021   HDL 36 (L)  08/07/2021   LDLCALC 98 08/07/2021   TRIG 87 08/07/2021   CHOLHDL 4.2 08/07/2021                                             Patient is on Parenteral Testosterone replacement every 10 days  and he relates on therapy he has an improved sense of well being.         Patient has hx/o abn glucose  and patient denies reactive hypoglycemic symptoms, visual blurring, diabetic polys or paresthesias. Last A1c was normal & at goal :   Lab Results  Component Value Date   HGBA1C 5.6 03/06/2021         Finally, patient has history of Vitamin D Deficiency and last vitamin D was at goal :   Lab Results  Component Value Date   VD25OH 2 10/02/2020     Current Outpatient Medications  Medication Instructions   VITAMIN D 1,000 Units Daily   co-enzyme Q-10    30 mg  3 times daily   meloxicam  15 MG tablet Take  1/2 to 1 tablet  Daily   Omega-3 FISH OIL 1200 MG CAPS Oral, 3 times daily   allergy tablet Takes PRN    oxymetazoline (AFRIN) 0.05 % nasal spray 1 spray, Each Nare, 2 times daily   pantoprazole 40 MG tablet TAKE 1 TABLET  DAILY    rosuvastatin  20 MG tablet Take  1 tablet  Daily    terazosin (HYTRIN) 10 MG capsule Take 1 capsule at bedtime   testosterone cypio  200 MG/ML in INJECT 1ML IM EVERY WEEK     No Known Allergies    Health Maintenance  Topic Date Due   COVID-19 Vaccine (1) Never done   Pneumonia Vaccine 46+ Years old (1 - PCV) Never done   Zoster Vaccines- Shingrix (1 of 2) Never done   Fecal DNA (Cologuard)  10/28/2020   INFLUENZA VACCINE  Never done   TETANUS/TDAP  10/19/2021 (Originally 10/29/1969)   Hepatitis C Screening  Completed   HPV VACCINES  Aged Out    There is no immunization history on file for this patient. -  Patient refuses any  & all Vaccinations  Last Colon - Patient refuses   He did agree to  Cologard - 10/28/2017 Negative   03/17/2021   Repeat Cologard - Positive   07/11/2021 Colonoscopy - Dr Loletha Carrow - 3 polyps -  Recc 1 year f/u  Colon   Social History   Tobacco Use   Smoking status: Former   Smokeless tobacco: Never  Substance Use Topics   Alcohol use: No      ROS Constitutional: Denies fever, chills, weight loss/gain, headaches, insomnia,  night sweats or change in appetite. Does c/o fatigue. Eyes: Denies redness, blurred vision, diplopia, discharge, itchy or watery eyes.  ENT: Denies discharge, congestion, post nasal drip, epistaxis, sore throat, earache, hearing loss, dental pain, Tinnitus, Vertigo, Sinus pain or snoring.  Cardio: Denies chest pain,  palpitations, irregular heartbeat, syncope, dyspnea, diaphoresis, orthopnea, PND, claudication or edema Respiratory: denies cough, dyspnea, DOE, pleurisy, hoarseness, laryngitis or wheezing.  Gastrointestinal: Denies dysphagia, heartburn, reflux, water brash, pain, cramps, nausea, vomiting, bloating, diarrhea, constipation, hematemesis, melena, hematochezia, jaundice or hemorrhoids Genitourinary: Denies dysuria, frequency, discharge, hematuria or flank pain. Has urgency, nocturia x 2-3 & occasional hesitancy. Musculoskeletal: Denies arthralgia, myalgia, stiffness, Jt. Swelling, pain, limp or strain/sprain. Denies Falls. Skin: Denies puritis, rash, hives, warts, acne, eczema or change in skin lesion Neuro: No weakness, tremor, incoordination, spasms, paresthesia or pain Psychiatric: Denies confusion, memory loss or sensory loss. Denies Depression. Endocrine: Denies change in weight, skin, hair change, nocturia, and paresthesia, diabetic polys, visual blurring or hyper / hypo glycemic episodes.  Heme/Lymph: No excessive bleeding, bruising or enlarged lymph nodes.   Physical Exam  There were no vitals taken for this visit.  General Appearance: Well nourished and well groomed and in no apparent distress.  Eyes: PERRLA, EOMs, conjunctiva no swelling or erythema, normal fundi and vessels. Sinuses: No frontal/maxillary tenderness ENT/Mouth: EACs patent / TMs  nl.  Nares clear without erythema, swelling, mucoid exudates. Oral hygiene is good. No erythema, swelling, or exudate. Tongue normal, non-obstructing. Tonsils not swollen or erythematous. Hearing normal.  Neck: Supple, thyroid not palpable. No bruits, nodes or JVD. Respiratory: Respiratory effort normal.  BS equal and clear bilateral without rales, rhonci, wheezing or stridor. Cardio: Heart sounds are normal with regular rate and rhythm and no murmurs, rubs or gallops. Peripheral pulses are normal and equal bilaterally without edema. No aortic or femoral bruits. Chest: symmetric with normal excursions and percussion.  Abdomen: Soft, with Nl bowel sounds. Nontender, no guarding, rebound, hernias, masses, or organomegaly.  Lymphatics: Non tender without lymphadenopathy.  Musculoskeletal: Full ROM all peripheral extremities, joint stability, 5/5 strength, and normal gait. Skin: Warm and dry without rashes, lesions, cyanosis, clubbing or  ecchymosis.  Neuro: Cranial nerves intact, reflexes equal bilaterally. Normal muscle tone, no cerebellar symptoms. Sensation intact.  Pysch: Alert and oriented X 3 with normal affect, insight and judgment appropriate.   Assessment and Plan  1. Annual Preventative/Screening Exam   2. Labile hypertension  - EKG 12-Lead - [place], RETROPERITNL ABD,  LTD - Urinalysis, Routine w reflex microscopic - Microalbumin / creatinine urine ratio - CBC with Differential/Platelet - COMPLETE METABOLIC PANEL WITH GFR - Magnesium - TSH  3. Hyperlipidemia, mixed  - EKG 12-Lead - Lipid panel - TSH  4. Abnormal glucose  - EKG 12-Lead - [place], RETROPERITNL ABD,  LTD - Hemoglobin A1c - Insulin, random  5. Vitamin D deficiency  - VITAMIN D 25 Hydroxy  6. CKD (chronic kidney disease) stage 2, GFR 60-89 ml/min  - Microalbumin / creatinine urine ratio - COMPLETE METABOLIC PANEL WITH GFR  7. Testosterone deficiency  - Testosterone  8. Screening for colorectal cancer  - POC  Hemoccult Bld/Stl (3-Cd Home Screen); Future  9. BPH with obstruction/lower urinary tract symptoms  - PSA  10. Prostate cancer screening  - PSA  11. Screening for heart disease  - EKG 12-Lead  12. FHx: heart disease  - EKG 12-Lead - [place], RETROPERITNL ABD,  LTD  13. Former smoker  - EKG 12-Lead - [place], RETROPERITNL ABD,  LTD  14. Screening for AAA (aortic abdominal aneurysm)  - [place], RETROPERITNL ABD,  LTD  15. Medication management  - Urinalysis, Routine w reflex microscopic - Microalbumin / creatinine urine ratio - Testosterone - CBC with Differential/Platelet - COMPLETE METABOLIC PANEL WITH GFR - Magnesium -  Lipid panel - TSH - Hemoglobin A1c - Insulin, random - VITAMIN D 25 Hydroxy          Patient was counseled in prudent diet, weight control to achieve/maintain BMI less than 25, BP monitoring, regular exercise and medications as discussed.  Discussed med effects and SE's. Routine screening labs and tests as requested with regular follow-up as recommended. Over 40 minutes of exam, counseling, chart review and high complex critical decision making was performed   Kirtland Bouchard, MD

## 2022-03-10 ENCOUNTER — Ambulatory Visit: Payer: PPO | Admitting: Internal Medicine

## 2022-03-10 DIAGNOSIS — Z125 Encounter for screening for malignant neoplasm of prostate: Secondary | ICD-10-CM

## 2022-03-10 DIAGNOSIS — Z1211 Encounter for screening for malignant neoplasm of colon: Secondary | ICD-10-CM

## 2022-03-10 DIAGNOSIS — Z8249 Family history of ischemic heart disease and other diseases of the circulatory system: Secondary | ICD-10-CM

## 2022-03-10 DIAGNOSIS — R7309 Other abnormal glucose: Secondary | ICD-10-CM

## 2022-03-10 DIAGNOSIS — N138 Other obstructive and reflux uropathy: Secondary | ICD-10-CM

## 2022-03-10 DIAGNOSIS — E349 Endocrine disorder, unspecified: Secondary | ICD-10-CM

## 2022-03-10 DIAGNOSIS — R0989 Other specified symptoms and signs involving the circulatory and respiratory systems: Secondary | ICD-10-CM

## 2022-03-10 DIAGNOSIS — Z0001 Encounter for general adult medical examination with abnormal findings: Secondary | ICD-10-CM

## 2022-03-10 DIAGNOSIS — E782 Mixed hyperlipidemia: Secondary | ICD-10-CM

## 2022-03-10 DIAGNOSIS — Z79899 Other long term (current) drug therapy: Secondary | ICD-10-CM

## 2022-03-10 DIAGNOSIS — Z87891 Personal history of nicotine dependence: Secondary | ICD-10-CM

## 2022-03-10 DIAGNOSIS — N182 Chronic kidney disease, stage 2 (mild): Secondary | ICD-10-CM

## 2022-03-10 DIAGNOSIS — E559 Vitamin D deficiency, unspecified: Secondary | ICD-10-CM

## 2022-03-10 DIAGNOSIS — I10 Essential (primary) hypertension: Secondary | ICD-10-CM

## 2022-03-10 DIAGNOSIS — Z136 Encounter for screening for cardiovascular disorders: Secondary | ICD-10-CM

## 2022-03-19 ENCOUNTER — Ambulatory Visit: Payer: PPO | Admitting: Internal Medicine

## 2022-03-19 DIAGNOSIS — I10 Essential (primary) hypertension: Secondary | ICD-10-CM

## 2022-03-19 NOTE — Progress Notes (Signed)
2sd  Last Minute   Same  Day Cancellation            Needs 3 mo OV & WELLNESS - Tonya  Cancel 08/08/22 OV  Needs 6 mo OV - Neelam Tiggs  NEEDS 1 year CPE - McK                                   Future Appointments  Date Time Provider Department  03/19/2022  3:30 PM [name], MD [facility]  08/08/2022 11:00 AM Darrol Jump, NP [facility]            This very nice 71 y.o. MWM with  labile HTN, HLD, Prediabetes, Testosterone Deficiency  and Vitamin D Deficiency who  presents for a Screening /Preventative Visit & comprehensive evaluation and management of multiple medical co-morbidities.   Patient has hx/o GERD controlled on his meds.       Labile HTN predates circa 2015 .   Patient's BP has been controlled  at home.  Today's BP is at goal -                              .  Patient has CKD2 (GFR 78) . Patient denies any cardiac symptoms as chest pain, palpitations, shortness of breath, dizziness or ankle swelling.       Patient's hyperlipidemia is controlled with diet and Rosuvastatin . Patient denies myalgias or other medication  SE's. Last lipids were  at goal :  Lab Results  Component Value Date   CHOL 152 08/07/2021   HDL 36 (L) 08/07/2021   LDLCALC 98 08/07/2021   TRIG 87 08/07/2021   CHOLHDL 4.2 08/07/2021                                             Patient is on Parenteral Testosterone replacement every 10 days  and he relates on therapy he has an improved sense of well being.         Patient has hx/o abn glucose  and patient denies reactive hypoglycemic symptoms, visual blurring, diabetic polys or paresthesias. Last A1c was normal & at goal :   Lab Results  Component Value Date   HGBA1C 5.6 03/06/2021         Finally, patient has history of Vitamin D Deficiency and last vitamin D was at goal :   Lab Results  Component Value Date   VD25OH 77 10/02/2020     Current Outpatient Medications  Medication Instructions   VITAMIN D 1,000 Units Daily   co-enzyme Q-10    30 mg  3 times daily   meloxicam  15 MG tablet Take  1/2 to 1 tablet  Daily   Omega-3 FISH OIL 1200 MG CAPS Oral, 3 times daily   allergy tablet Takes PRN    oxymetazoline (AFRIN) 0.05 % nasal spray 1 spray, Each Nare, 2 times daily   pantoprazole 40 MG tablet TAKE 1 TABLET  DAILY    rosuvastatin  20 MG tablet Take  1 tablet  Daily    terazosin (HYTRIN) 10 MG capsule Take 1 capsule at bedtime   testosterone cypio  200 MG/ML in INJECT 1ML IM EVERY WEEK     No Known Allergies    Health Maintenance  Topic Date Due   COVID-19 Vaccine (1) Never done   Pneumonia Vaccine 65+ Years old (1 - PCV) Never done   Zoster Vaccines- Shingrix (1 of 2) Never done   Fecal DNA (Cologuard)  10/28/2020   INFLUENZA VACCINE  Never done   TETANUS/TDAP  10/19/2021 (Originally 10/29/1969)   Hepatitis C Screening  Completed   HPV VACCINES  Aged Out    There is no immunization history on file for this patient. -  Patient refuses any  & all Vaccinations  Last Colon - Patient refuses   He did agree to  Cologard - 10/28/2017 Negative    03/17/2021   Repeat Cologard - Positive   07/11/2021 Colonoscopy - Dr Loletha Carrow - 3 polyps -  Recc 1 year f/u Colon    Social History   Tobacco Use   Smoking status: Former   Smokeless tobacco: Never  Substance Use Topics   Alcohol use: No      ROS Constitutional: Denies fever, chills, weight loss/gain, headaches, insomnia,  night sweats or change in  appetite. Does c/o fatigue. Eyes: Denies redness, blurred vision, diplopia, discharge, itchy or watery eyes.  ENT: Denies discharge, congestion, post nasal drip, epistaxis, sore throat, earache, hearing loss, dental pain, Tinnitus, Vertigo, Sinus pain or snoring.  Cardio: Denies chest pain, palpitations, irregular heartbeat, syncope, dyspnea, diaphoresis, orthopnea, PND, claudication or edema Respiratory: denies cough, dyspnea, DOE, pleurisy, hoarseness, laryngitis or wheezing.  Gastrointestinal: Denies dysphagia, heartburn, reflux, water brash, pain, cramps, nausea, vomiting, bloating, diarrhea, constipation, hematemesis, melena, hematochezia, jaundice or hemorrhoids Genitourinary: Denies dysuria, frequency, discharge, hematuria or flank pain. Has urgency, nocturia x 2-3 & occasional hesitancy. Musculoskeletal: Denies arthralgia, myalgia, stiffness, Jt. Swelling, pain, limp or strain/sprain. Denies Falls. Skin: Denies puritis, rash, hives, warts, acne, eczema or change in skin lesion Neuro: No weakness, tremor, incoordination, spasms, paresthesia or pain Psychiatric: Denies confusion, memory loss or sensory loss. Denies Depression. Endocrine: Denies change in weight, skin, hair change, nocturia, and paresthesia, diabetic polys, visual blurring or hyper / hypo glycemic episodes.  Heme/Lymph: No excessive bleeding, bruising or enlarged lymph nodes.   Physical Exam  There were no vitals taken for this visit.  General Appearance: Well nourished and well groomed and in no apparent distress.  Eyes: PERRLA, EOMs, conjunctiva no swelling  or erythema, normal fundi and vessels. Sinuses: No frontal/maxillary tenderness ENT/Mouth: EACs patent / TMs  nl. Nares clear without erythema, swelling, mucoid exudates. Oral hygiene is good. No erythema, swelling, or exudate. Tongue normal, non-obstructing. Tonsils not swollen or erythematous. Hearing normal.  Neck: Supple, thyroid not palpable. No bruits, nodes or JVD. Respiratory: Respiratory effort normal.  BS equal and clear bilateral without rales, rhonci, wheezing or stridor. Cardio: Heart sounds are normal with regular rate and rhythm and no murmurs, rubs or gallops. Peripheral pulses are normal and equal bilaterally without edema. No aortic or femoral bruits. Chest: symmetric with normal excursions and percussion.  Abdomen: Soft, with Nl bowel sounds. Nontender, no guarding, rebound, hernias, masses, or organomegaly.  Lymphatics: Non tender without lymphadenopathy.  Musculoskeletal: Full ROM all peripheral extremities, joint stability, 5/5 strength, and normal gait. Skin: Warm and dry without rashes, lesions, cyanosis, clubbing or  ecchymosis.  Neuro: Cranial nerves intact, reflexes equal bilaterally. Normal muscle tone, no cerebellar symptoms. Sensation intact.  Pysch: Alert and oriented X 3 with normal affect, insight and judgment appropriate.   Assessment and Plan  1. Labile hypertension  - CBC with Differential/Platelet - COMPLETE METABOLIC PANEL WITH GFR - Magnesium - TSH  2. Hyperlipidemia, mixed  - Lipid panel - TSH  3. Abnormal glucose  - Hemoglobin A1c - Insulin, random  4. Vitamin D deficiency  - VITAMIN D 25 Hydroxy   5. Testosterone deficiency  - Testosterone  6. Medication management  - CBC with Differential/Platelet - COMPLETE METABOLIC PANEL WITH GFR - Magnesium - Lipid panel - TSH - Hemoglobin A1c - Insulin, random - VITAMIN D 25 Hydroxy  - Testosterone          Patient was counseled in prudent diet, weight control to achieve/maintain  BMI less than 25, BP monitoring, regular exercise and medications as discussed.  Discussed med effects and SE's. Routine screening labs and tests as requested with regular follow-up as recommended. Over 40 minutes of exam, counseling, chart review and high complex critical decision making was performed   Kirtland Bouchard, MD

## 2022-03-26 ENCOUNTER — Encounter: Payer: Self-pay | Admitting: Internal Medicine

## 2022-03-26 ENCOUNTER — Ambulatory Visit: Payer: PPO | Admitting: Internal Medicine

## 2022-03-26 DIAGNOSIS — Z0000 Encounter for general adult medical examination without abnormal findings: Secondary | ICD-10-CM

## 2022-03-26 NOTE — Progress Notes (Signed)
[facility] Dixon                                                                                                                                                                                                                                                                                                                   Future Appointments  Date Time Provider Department  03/19/2022  3:30 PM [name], MD [facility]  08/08/2022 11:00 AM Darrol Jump, NP [facility]            This very nice 71 y.o. MWM with  labile HTN, HLD, Prediabetes, Testosterone Deficiency,  GERD and Vitamin D Deficiency who  presents for follow-up.       Labile HTN predates circa 2015 .   Patient's BP has been controlled at home.  Today's BP is at goal -                              .  Patient has CKD2 (GFR 78) . Patient denies any cardiac symptoms as chest pain, palpitations, shortness of breath, dizziness or ankle swelling.       Patient's hyperlipidemia is controlled with diet and Rosuvastatin . Patient denies myalgias or other medication SE's. Last lipids were  at goal :  Lab Results  Component Value Date   CHOL 152 08/07/2021   HDL 36 (L) 08/07/2021   LDLCALC 98 08/07/2021   TRIG 87 08/07/2021   CHOLHDL 4.2 08/07/2021                                             Patient is on Parenteral Testosterone replacement every 10 days  and he relates on therapy he has an improved sense of well being.  Patient has hx/o abn glucose  and patient denies reactive hypoglycemic symptoms, visual blurring, diabetic polys or paresthesias. Last A1c was normal & at goal :   Lab Results  Component  Value Date   HGBA1C 5.6 03/06/2021         Finally, patient has history of Vitamin D Deficiency and last vitamin D was at goal :   Lab Results  Component Value Date   VD25OH 76 10/02/2020     Current Outpatient Medications  Medication Instructions   VITAMIN D 1,000 Units Daily   co-enzyme Q-10    30 mg  3 times daily   meloxicam  15 MG tablet Take  1/2 to 1 tablet  Daily   Omega-3 FISH OIL 1200 MG CAPS Oral, 3 times daily   allergy tablet Takes PRN    oxymetazoline (AFRIN) 0.05 % nasal spray 1 spray, Each Nare, 2 times daily   pantoprazole 40 MG tablet TAKE 1 TABLET  DAILY    rosuvastatin  20 MG tablet Take  1 tablet  Daily    terazosin (HYTRIN) 10 MG capsule Take 1 capsule at bedtime   testosterone cypio  200 MG/ML in INJECT 1ML IM EVERY WEEK     No Known Allergies    Health Maintenance  Topic Date Due   COVID-19 Vaccine (1) Never done   Pneumonia Vaccine 31+ Years old (1 - PCV) Never done   Zoster Vaccines- Shingrix (1 of 2) Never done   Fecal DNA (Cologuard)  10/28/2020   INFLUENZA VACCINE  Never done   TETANUS/TDAP  10/19/2021 (Originally 10/29/1969)   Hepatitis C Screening  Completed   HPV VACCINES  Aged Out    There is no immunization history on file for this patient. -  Patient refuses any  & all Vaccinations  Last Colon - Patient refuses   He did agree to  Cologard - 10/28/2017 Negative   03/17/2021   Repeat Cologard - Positive   07/11/2021 Colonoscopy - Dr Loletha Carrow - 3 polyps -  Recc 1 year f/u Colon    Social History   Tobacco Use   Smoking status: Former   Smokeless tobacco: Never  Substance Use Topics   Alcohol use: No      ROS Constitutional: Denies fever, chills, weight loss/gain, headaches, insomnia,  night sweats or change in appetite. Does c/o fatigue. Eyes: Denies redness, blurred vision, diplopia, discharge, itchy or watery eyes.  ENT: Denies discharge, congestion, post nasal drip, epistaxis, sore throat, earache, hearing loss,  dental pain, Tinnitus, Vertigo, Sinus pain or snoring.  Cardio: Denies chest pain, palpitations, irregular heartbeat, syncope, dyspnea, diaphoresis, orthopnea, PND, claudication or edema Respiratory: denies cough, dyspnea, DOE, pleurisy, hoarseness, laryngitis or wheezing.  Gastrointestinal: Denies dysphagia, heartburn, reflux, water brash, pain, cramps, nausea, vomiting, bloating, diarrhea, constipation, hematemesis, melena, hematochezia, jaundice or hemorrhoids Genitourinary: Denies dysuria, frequency, discharge, hematuria or flank pain. Has urgency, nocturia x 2-3 & occasional hesitancy. Musculoskeletal: Denies arthralgia, myalgia, stiffness, Jt. Swelling, pain, limp or strain/sprain. Denies Falls. Skin: Denies puritis, rash, hives, warts, acne, eczema or change in skin lesion Neuro: No weakness, tremor, incoordination, spasms, paresthesia or pain Psychiatric: Denies confusion, memory loss or sensory loss. Denies Depression. Endocrine: Denies change in weight, skin, hair change, nocturia, and paresthesia, diabetic polys, visual blurring or hyper / hypo glycemic episodes.  Heme/Lymph: No excessive bleeding, bruising or enlarged lymph nodes.   Physical Exam  There were no vitals taken for this visit.  General Appearance: Well nourished and well groomed and  in no apparent distress.  Eyes: PERRLA, EOMs, conjunctiva no swelling or erythema, normal fundi and vessels. Sinuses: No frontal/maxillary tenderness ENT/Mouth: EACs patent / TMs  nl. Nares clear without erythema, swelling, mucoid exudates. Oral hygiene is good. No erythema, swelling, or exudate. Tongue normal, non-obstructing. Tonsils not swollen or erythematous. Hearing normal.  Neck: Supple, thyroid not palpable. No bruits, nodes or JVD. Respiratory: Respiratory effort normal.  BS equal and clear bilateral without rales, rhonci, wheezing or stridor. Cardio: Heart sounds are normal with regular rate and rhythm and no murmurs, rubs or  gallops. Peripheral pulses are normal and equal bilaterally without edema. No aortic or femoral bruits. Chest: symmetric with normal excursions and percussion.  Abdomen: Soft, with Nl bowel sounds. Nontender, no guarding, rebound, hernias, masses, or organomegaly.  Lymphatics: Non tender without lymphadenopathy.  Musculoskeletal: Full ROM all peripheral extremities, joint stability, 5/5 strength, and normal gait. Skin: Warm and dry without rashes, lesions, cyanosis, clubbing or  ecchymosis.  Neuro: Cranial nerves intact, reflexes equal bilaterally. Normal muscle tone, no cerebellar symptoms. Sensation intact.  Pysch: Alert and oriented X 3 with normal affect, insight and judgment appropriate.   Assessment and Plan  1. Labile hypertension  - CBC with Differential/Platelet - COMPLETE METABOLIC PANEL WITH GFR - Magnesium - TSH  2. Hyperlipidemia, mixed  - Lipid panel - TSH  3. Abnormal glucose  - Hemoglobin A1c - Insulin, random  4. Vitamin D deficiency  - VITAMIN D 25 Hydroxy   5. Testosterone deficiency  - Testosterone  6. Medication management  - CBC with Differential/Platelet - COMPLETE METABOLIC PANEL WITH GFR - Magnesium - Lipid panel - TSH - Hemoglobin A1c - Insulin, random - VITAMIN D 25 Hydroxy  - Testosterone          Patient was counseled in prudent diet, weight control to achieve/maintain BMI less than 25, BP monitoring, regular exercise and medications as discussed.  Discussed med effects and SE's. Routine screening labs and tests as requested with regular follow-up as recommended. Over 40 minutes of exam, counseling, chart review and high complex critical decision making was performed   Kirtland Bouchard, MD

## 2022-03-26 NOTE — Patient Instructions (Signed)

## 2022-03-29 ENCOUNTER — Other Ambulatory Visit: Payer: Self-pay | Admitting: Internal Medicine

## 2022-03-29 DIAGNOSIS — G8921 Chronic pain due to trauma: Secondary | ICD-10-CM

## 2022-04-08 ENCOUNTER — Encounter: Payer: Self-pay | Admitting: Internal Medicine

## 2022-04-08 NOTE — Patient Instructions (Signed)

## 2022-04-08 NOTE — Progress Notes (Unsigned)
Future Appointments  Date Time Provider Department  04/09/2022  3:30 PM [name], MD [facility]  08/08/2022                      wellness 11:00 AM Darrol Jump, NP [facility]            This very nice 72 y.o. MWM with  labile HTN, HLD, Prediabetes, Testosterone Deficiency,  GERD and Vitamin D Deficiency who  presents for follow-up.        Labile HTN predates circa 2015 .   Patient's BP has been monitored expectantly and  today's BP is at goal -                              .  Patient has CKD2 (GFR 78) . Patient denies any cardiac symptoms as chest pain, palpitations, shortness of breath, dizziness or ankle swelling.        Patient's hyperlipidemia is controlled with diet and Rosuvastatin . Patient denies myalgias or other medication SE's. Last lipids were  at goal :  Lab Results  Component Value Date   CHOL 152 08/07/2021   HDL 36 (L) 08/07/2021   LDLCALC 98 08/07/2021   TRIG 87 08/07/2021   CHOLHDL 4.2 08/07/2021                                             Patient is on Parenteral Testosterone replacement every 10 days  and he relates on therapy he has an improved sense of well being.         Patient has hx/o abn glucose  and patient denies reactive hypoglycemic symptoms, visual blurring, diabetic polys or paresthesias. Last A1c was normal & at goal :   Lab Results  Component Value Date   HGBA1C 5.6 03/06/2021         Finally, patient has history of Vitamin D Deficiency and last vitamin D was at goal :   Lab Results  Component Value Date   VD25OH 101 (H) 08/07/2021       Current Outpatient Medications  Medication Instructions   VITAMIN D 1,000 Units Daily   co-enzyme Q-10    30 mg  3 times daily   meloxicam  15 MG tablet Take  1/2 to 1 tablet  Daily   Omega-3 FISH OIL 1200 MG CAPS Oral, 3 times daily   allergy tablet Takes PRN    oxymetazoline (AFRIN) 0.05 % nasal spray 1 spray, Each Nare, 2 times daily   pantoprazole 40 MG tablet TAKE 1 TABLET   DAILY    rosuvastatin  20 MG tablet Take  1 tablet  Daily    terazosin (HYTRIN) 10 MG capsule Take 1 capsule at bedtime   testosterone cypio  200 MG/ML in INJECT 1ML IM EVERY WEEK     No Known Allergies    Health Maintenance  Topic Date Due   COVID-19 Vaccine (1) Never done   Pneumonia Vaccine 7+ Years old (1 - PCV) Never done   Zoster Vaccines- Shingrix (1 of 2) Never done   Fecal DNA (Cologuard)  10/28/2020   INFLUENZA VACCINE  Never done   TETANUS/TDAP  10/19/2021 (Originally 10/29/1969)   Hepatitis C Screening  Completed   HPV VACCINES  Aged Out    There is  no immunization history on file for this patient. -  Patient refuses any  & all Vaccinations  Last Colon - Patient refuses   He did agree to  Cologard - 10/28/2017 Negative   03/17/2021   Repeat Cologard - Positive   07/11/2021 Colonoscopy - Dr Loletha Carrow - 3 polyps -  Recc 1 year f/u Colon    Social History   Tobacco Use   Smoking status: Former   Smokeless tobacco: Never  Substance Use Topics   Alcohol use: No      ROS Constitutional: Denies fever, chills, weight loss/gain, headaches, insomnia,  night sweats or change in appetite. Does c/o fatigue. Eyes: Denies redness, blurred vision, diplopia, discharge, itchy or watery eyes.  ENT: Denies discharge, congestion, post nasal drip, epistaxis, sore throat, earache, hearing loss, dental pain, Tinnitus, Vertigo, Sinus pain or snoring.  Cardio: Denies chest pain, palpitations, irregular heartbeat, syncope, dyspnea, diaphoresis, orthopnea, PND, claudication or edema Respiratory: denies cough, dyspnea, DOE, pleurisy, hoarseness, laryngitis or wheezing.  Gastrointestinal: Denies dysphagia, heartburn, reflux, water brash, pain, cramps, nausea, vomiting, bloating, diarrhea, constipation, hematemesis, melena, hematochezia, jaundice or hemorrhoids Genitourinary: Denies dysuria, frequency, discharge, hematuria or flank pain. Has urgency, nocturia x 2-3 & occasional  hesitancy. Musculoskeletal: Denies arthralgia, myalgia, stiffness, Jt. Swelling, pain, limp or strain/sprain. Denies Falls. Skin: Denies puritis, rash, hives, warts, acne, eczema or change in skin lesion Neuro: No weakness, tremor, incoordination, spasms, paresthesia or pain Psychiatric: Denies confusion, memory loss or sensory loss. Denies Depression. Endocrine: Denies change in weight, skin, hair change, nocturia, and paresthesia, diabetic polys, visual blurring or hyper / hypo glycemic episodes.  Heme/Lymph: No excessive bleeding, bruising or enlarged lymph nodes.   Physical Exam  There were no vitals taken for this visit.  General Appearance: Well nourished and well groomed and in no apparent distress.  Eyes: PERRLA, EOMs, conjunctiva no swelling or erythema, normal fundi and vessels. Sinuses: No frontal/maxillary tenderness ENT/Mouth: EACs patent / TMs  nl. Nares clear without erythema, swelling, mucoid exudates. Oral hygiene is good. No erythema, swelling, or exudate. Tongue normal, non-obstructing. Tonsils not swollen or erythematous. Hearing normal.  Neck: Supple, thyroid not palpable. No bruits, nodes or JVD. Respiratory: Respiratory effort normal.  BS equal and clear bilateral without rales, rhonci, wheezing or stridor. Cardio: Heart sounds are normal with regular rate and rhythm and no murmurs, rubs or gallops. Peripheral pulses are normal and equal bilaterally without edema. No aortic or femoral bruits. Chest: symmetric with normal excursions and percussion.  Abdomen: Soft, with Nl bowel sounds. Nontender, no guarding, rebound, hernias, masses, or organomegaly.  Lymphatics: Non tender without lymphadenopathy.  Musculoskeletal: Full ROM all peripheral extremities, joint stability, 5/5 strength, and normal gait. Skin: Warm and dry without rashes, lesions, cyanosis, clubbing or  ecchymosis.  Neuro: Cranial nerves intact, reflexes equal bilaterally. Normal muscle tone, no cerebellar  symptoms. Sensation intact.  Pysch: Alert and oriented X 3 with normal affect, insight and judgment appropriate.   Assessment and Plan  1. Labile hypertension  - CBC with Differential/Platelet - COMPLETE METABOLIC PANEL WITH GFR - Magnesium - TSH  2. Hyperlipidemia, mixed  - Lipid panel - TSH  3. Abnormal glucose  - Hemoglobin A1c - Insulin, random  4. Vitamin D deficiency  - VITAMIN D 25 Hydroxy   5. Testosterone deficiency  - Testosterone  6. Medication management  - CBC with Differential/Platelet - COMPLETE METABOLIC PANEL WITH GFR - Magnesium - Lipid panel - Hemoglobin A1c - TSH - Insulin, random - VITAMIN D 25  Hydroxy  - Testosterone           Patient was counseled in prudent diet, weight control to achieve/maintain BMI less than 25, BP monitoring, regular exercise and medications as discussed.  Discussed med effects and SE's. Routine screening labs and tests as requested with regular follow-up as recommended. Over 40 minutes of exam, counseling, chart review and high complex critical decision making was performed   Kirtland Bouchard, MD

## 2022-04-09 ENCOUNTER — Ambulatory Visit (INDEPENDENT_AMBULATORY_CARE_PROVIDER_SITE_OTHER): Payer: PPO | Admitting: Internal Medicine

## 2022-04-09 ENCOUNTER — Encounter: Payer: Self-pay | Admitting: Internal Medicine

## 2022-04-09 VITALS — BP 112/64 | HR 79 | Temp 98.2°F | Resp 16 | Ht 69.0 in | Wt 177.2 lb

## 2022-04-09 DIAGNOSIS — M25512 Pain in left shoulder: Secondary | ICD-10-CM

## 2022-04-09 DIAGNOSIS — R7309 Other abnormal glucose: Secondary | ICD-10-CM | POA: Diagnosis not present

## 2022-04-09 DIAGNOSIS — Z79899 Other long term (current) drug therapy: Secondary | ICD-10-CM

## 2022-04-09 DIAGNOSIS — E782 Mixed hyperlipidemia: Secondary | ICD-10-CM | POA: Diagnosis not present

## 2022-04-09 DIAGNOSIS — R0989 Other specified symptoms and signs involving the circulatory and respiratory systems: Secondary | ICD-10-CM

## 2022-04-09 DIAGNOSIS — E349 Endocrine disorder, unspecified: Secondary | ICD-10-CM | POA: Diagnosis not present

## 2022-04-09 DIAGNOSIS — E559 Vitamin D deficiency, unspecified: Secondary | ICD-10-CM | POA: Diagnosis not present

## 2022-04-09 MED ORDER — DEXAMETHASONE 4 MG PO TABS: ORAL_TABLET | ORAL | 0 refills | Status: DC

## 2022-04-10 LAB — CBC WITH DIFFERENTIAL/PLATELET
Absolute Monocytes: 496 cells/uL (ref 200–950)
Basophils Absolute: 31 cells/uL (ref 0–200)
Basophils Relative: 0.5 %
Eosinophils Absolute: 161 cells/uL (ref 15–500)
Eosinophils Relative: 2.6 %
HCT: 38.2 % — ABNORMAL LOW (ref 38.5–50.0)
Hemoglobin: 13 g/dL — ABNORMAL LOW (ref 13.2–17.1)
Lymphs Abs: 1513 cells/uL (ref 850–3900)
MCH: 31.4 pg (ref 27.0–33.0)
MCHC: 34 g/dL (ref 32.0–36.0)
MCV: 92.3 fL (ref 80.0–100.0)
MPV: 9.2 fL (ref 7.5–12.5)
Monocytes Relative: 8 %
Neutro Abs: 3999 cells/uL (ref 1500–7800)
Neutrophils Relative %: 64.5 %
Platelets: 354 10*3/uL (ref 140–400)
RBC: 4.14 10*6/uL — ABNORMAL LOW (ref 4.20–5.80)
RDW: 12.8 % (ref 11.0–15.0)
Total Lymphocyte: 24.4 %
WBC: 6.2 10*3/uL (ref 3.8–10.8)

## 2022-04-10 LAB — LIPID PANEL
Cholesterol: 126 mg/dL (ref <200)
HDL: 37 mg/dL — ABNORMAL LOW (ref >=40)
LDL Cholesterol (Calc): 61 mg/dL (calc)
Non-HDL Cholesterol (Calc): 89 mg/dL (calc) (ref <130)
Total CHOL/HDL Ratio: 3.4 (calc) (ref <5.0)
Triglycerides: 210 mg/dL — ABNORMAL HIGH (ref <150)

## 2022-04-10 LAB — COMPLETE METABOLIC PANEL WITH GFR
AG Ratio: 2 (calc) (ref 1.0–2.5)
ALT: 16 U/L (ref 9–46)
AST: 15 U/L (ref 10–35)
Albumin: 4.1 g/dL (ref 3.6–5.1)
Alkaline phosphatase (APISO): 62 U/L (ref 35–144)
BUN: 9 mg/dL (ref 7–25)
CO2: 29 mmol/L (ref 20–32)
Calcium: 8.5 mg/dL — ABNORMAL LOW (ref 8.6–10.3)
Chloride: 104 mmol/L (ref 98–110)
Creat: 1.01 mg/dL (ref 0.70–1.35)
Globulin: 2.1 g/dL (calc) (ref 1.9–3.7)
Glucose, Bld: 108 mg/dL — ABNORMAL HIGH (ref 65–99)
Potassium: 4.4 mmol/L (ref 3.5–5.3)
Sodium: 141 mmol/L (ref 135–146)
Total Bilirubin: 0.8 mg/dL (ref 0.2–1.2)
Total Protein: 6.2 g/dL (ref 6.1–8.1)
eGFR: 81 mL/min/{1.73_m2} (ref >=60)

## 2022-04-10 LAB — HEMOGLOBIN A1C
Hgb A1c MFr Bld: 5.5 % of total Hgb (ref <5.7)
Mean Plasma Glucose: 111 mg/dL
eAG (mmol/L): 6.2 mmol/L

## 2022-04-10 LAB — MAGNESIUM: Magnesium: 2 mg/dL (ref 1.5–2.5)

## 2022-04-10 LAB — VITAMIN D 25 HYDROXY (VIT D DEFICIENCY, FRACTURES): Vit D, 25-Hydroxy: 53 ng/mL (ref 30–100)

## 2022-04-10 LAB — TESTOSTERONE: Testosterone: 979 ng/dL — ABNORMAL HIGH (ref 250–827)

## 2022-04-10 LAB — INSULIN, RANDOM: Insulin: 17.8 u[IU]/mL

## 2022-04-10 LAB — TSH: TSH: 1.07 mIU/L (ref 0.40–4.50)

## 2022-04-11 NOTE — Progress Notes (Signed)
<><><><><><><><><><><><><><><><><><><><><><><><><><><><><><><><><> <><><><><><><><><><><><><><><><><><><><><><><><><><><><><><><><><>  -   Total Chol = 126     -    Excellent   - Very low risk for Heart Attack  / Stroke <><><><><><><><><><><><><><><><><><><><><><><><><><><><><><><><><>  -  Testosterone Nl / OK <><><><><><><><><><><><><><><><><><><><><><><><><><><><><><><><><>  -  A1c - Normal - No Diabetes  - Great   ! <><><><><><><><><><><><><><><><><><><><><><><><><><><><><><><><><>  -  Vit D - 53 - "OK" <><><><><><><><><><><><><><><><><><><><><><><><><><><><><><><><><>  -  All Else - CBC - Kidneys - Electrolytes - Liver - Magnesium & Thyroid    - all  Normal / OK <><><><><><><><><><><><><><><><><><><><><><><><><><><><><><><><><> <><><><><><><><><><><><><><><><><><><><><><><><><><><><><><><><><>

## 2022-04-18 ENCOUNTER — Other Ambulatory Visit: Payer: Self-pay | Admitting: Nurse Practitioner

## 2022-05-06 NOTE — Progress Notes (Signed)
Patient is aware of lab results and instructions. -e welch

## 2022-05-12 ENCOUNTER — Other Ambulatory Visit: Payer: Self-pay | Admitting: Internal Medicine

## 2022-05-12 DIAGNOSIS — N138 Other obstructive and reflux uropathy: Secondary | ICD-10-CM

## 2022-06-11 ENCOUNTER — Encounter: Payer: Self-pay | Admitting: Gastroenterology

## 2022-06-16 ENCOUNTER — Other Ambulatory Visit: Payer: Self-pay | Admitting: Internal Medicine

## 2022-06-16 DIAGNOSIS — E349 Endocrine disorder, unspecified: Secondary | ICD-10-CM

## 2022-06-16 MED ORDER — TESTOSTERONE CYPIONATE 200 MG/ML IM SOLN: INTRAMUSCULAR | 1 refills | Status: DC

## 2022-07-16 ENCOUNTER — Ambulatory Visit: Payer: PPO | Admitting: Nurse Practitioner

## 2022-07-17 ENCOUNTER — Other Ambulatory Visit: Payer: Self-pay | Admitting: Nurse Practitioner

## 2022-08-08 ENCOUNTER — Encounter: Payer: Self-pay | Admitting: Nurse Practitioner

## 2022-08-08 ENCOUNTER — Ambulatory Visit (INDEPENDENT_AMBULATORY_CARE_PROVIDER_SITE_OTHER): Payer: HMO | Admitting: Nurse Practitioner

## 2022-08-08 VITALS — BP 124/70 | HR 71 | Temp 98.5°F | Ht 69.0 in | Wt 174.4 lb

## 2022-08-08 DIAGNOSIS — Z6826 Body mass index (BMI) 26.0-26.9, adult: Secondary | ICD-10-CM

## 2022-08-08 DIAGNOSIS — N182 Chronic kidney disease, stage 2 (mild): Secondary | ICD-10-CM

## 2022-08-08 DIAGNOSIS — E349 Endocrine disorder, unspecified: Secondary | ICD-10-CM | POA: Diagnosis not present

## 2022-08-08 DIAGNOSIS — R0989 Other specified symptoms and signs involving the circulatory and respiratory systems: Secondary | ICD-10-CM | POA: Diagnosis not present

## 2022-08-08 DIAGNOSIS — I444 Left anterior fascicular block: Secondary | ICD-10-CM

## 2022-08-08 DIAGNOSIS — M25512 Pain in left shoulder: Secondary | ICD-10-CM | POA: Diagnosis not present

## 2022-08-08 DIAGNOSIS — K219 Gastro-esophageal reflux disease without esophagitis: Secondary | ICD-10-CM | POA: Diagnosis not present

## 2022-08-08 DIAGNOSIS — E559 Vitamin D deficiency, unspecified: Secondary | ICD-10-CM | POA: Diagnosis not present

## 2022-08-08 DIAGNOSIS — Z0000 Encounter for general adult medical examination without abnormal findings: Secondary | ICD-10-CM

## 2022-08-08 DIAGNOSIS — E782 Mixed hyperlipidemia: Secondary | ICD-10-CM

## 2022-08-08 DIAGNOSIS — Z79899 Other long term (current) drug therapy: Secondary | ICD-10-CM | POA: Diagnosis not present

## 2022-08-08 DIAGNOSIS — G8921 Chronic pain due to trauma: Secondary | ICD-10-CM

## 2022-08-08 NOTE — Patient Instructions (Signed)

## 2022-08-08 NOTE — Progress Notes (Signed)
MEDICARE ANNUAL WELLNESS VISIT AND FOLLOW UP Assessment:    Medicare preventive visit Due annually Healthy maintenance reviewed   Labile hypertension Discussed DASH (Dietary Approaches to Stop Hypertension) DASH diet is lower in sodium than a typical American diet. Cut back on foods that are high in saturated fat, cholesterol, and trans fats. Eat more whole-grain foods, fish, poultry, and nuts Remain active and exercise as tolerated daily.  Monitor BP at home-Call if greater than 130/80.  Check CMP/CBC  Left anterior fascicular block Monitor; EKG at CPE, no rate controlling drugs; refer to cardiology if progresses  Hyperlipidemia, mixed Discussed lifestyle modifications. Recommended diet heavy in fruits and veggies, omega 3's. Decrease consumption of animal meats, cheeses, and dairy products. Remain active and exercise as tolerated. Continue to monitor. Check lipids/TSH  Vitamin D deficiency Continue supplementation for goal of 60-100 At goal last check; defer vitamin D level  Testosterone deficiency continue to monitor, states medication is helping with symptoms of low T.   Acid reflux No suspected reflux complications (Barret/stricture). Lifestyle modification:  wt loss, avoid meals 2-3h before bedtime. Consider eliminating food triggers:  chocolate, caffeine, EtOH, acid/spicy food.  Chronic pain due to trauma Lower back pain; well managed by chiropractor visits and regular exercise  BMI 25.0-25.9,adult Discussed appropriate BMI Diet modification. Physical activity. Encouraged/praised to build confidence.  CKD III [facility]) Discussed how what you eat and drink can aide in kidney protection. Stay well hydrated. Avoid high salt foods. Avoid NSAIDS. Keep BP and BG well controlled.   Take medications as prescribed. Remain active and exercise as tolerated daily. Maintain weight.  Continue to monitor. Check CMP/GFR/Microablumin  Medication management All  medications discussed and reviewed in full. All questions and concerns regarding medications addressed.    Left bicep pain Refer to Orthopedics  Orders Placed This Encounter  Procedures   CBC with Differential/Platelet   COMPLETE METABOLIC PANEL WITH GFR   Lipid panel   VITAMIN D 25 Hydroxy (Vit-D Deficiency, Fractures)    Notify office for further evaluation and treatment, questions or concerns if any reported s/s fail to improve.   The patient was advised to call back or seek an in-person evaluation if any symptoms worsen or if the condition fails to improve as anticipated.   Further disposition pending results of labs. Discussed med's effects and SE's.    I discussed the assessment and treatment plan with the patient. The patient was provided an opportunity to ask questions and all were answered. The patient agreed with the plan and demonstrated an understanding of the instructions.  Discussed med's effects and SE's. Screening labs and tests as requested with regular follow-up as recommended.  I provided 35 minutes of face-to-face time during this encounter including counseling, chart review, and critical decision making was preformed.  Today's Plan of Care is based on a patient-centered health care approach known as shared decision making - the decisions, tests and treatments allow for patient preferences and values to be balanced with clinical evidence.    Future Appointments  Date Time Provider Department Center  11/10/2022  2:00 PM Lucky Cowboy, MD [facility] None     Plan:   During the course of the visit the patient was educated and counseled about appropriate screening and preventive services including:   Pneumococcal vaccine  Influenza vaccine Prevnar 13 Td vaccine Screening electrocardiogram Colorectal cancer screening Diabetes screening Glaucoma screening Nutrition counseling    Subjective:  Christian Dixon is a 72 y.o. male presents for Annual  Medicare Wellness  visit and follow up for chronic medication conditions.   New to our office in 2020, moved from [place].   Overall he reports feeling well today.    States increase in left bicep pain.  He is active in the gym, weight lifting.  Has noticed a sensation of a knot underneath the left bicep when doing curls.  Feels as though he has an indention in the left bicep.  Also states that his right leg is shorter than his left leg.  Uses inserts and requesting new ones.    he has a diagnosis of reflux which is currently managed by pantopazole 40 mg daily, did famotidine taper last year failed.  he reports symptoms is currently well controlled, and denies breakthrough reflux, burning in chest, hoarseness or cough.    BMI is Body mass index is 25.75 kg/m., he has been working on diet and exercise. Martial arts 4 days a week.  Wt Readings from Last 3 Encounters:  08/08/22 174 lb 6.4 oz (79.1 kg)  04/09/22 177 lb 3.2 oz (80.4 kg)  08/07/21 171 lb (77.6 kg)   His blood pressure has been controlled at home, today their BP is BP: 124/70  He does workout. He denies chest pain, shortness of breath, dizziness.   He is on cholesterol medication (rosuvastatin 20 mg daily) and denies myalgias. His cholesterol is not at goal. The cholesterol last visit was:   Lab Results  Component Value Date   CHOL 126 04/09/2022   HDL 37 (L) 04/09/2022   LDLCALC 61 04/09/2022   TRIG 210 (H) 04/09/2022   CHOLHDL 3.4 04/09/2022    He has been working on diet and exercise for glucose management, and denies nausea, paresthesia of the feet, polydipsia, polyuria, visual disturbances and vomiting. Last A1C in the office was:  Lab Results  Component Value Date   HGBA1C 5.5 04/09/2022   Lab Results  Component Value Date   GFRNONAA 69 10/02/2020   Patient is on Vitamin D supplement.   Lab Results  Component Value Date   VD25OH 22 04/09/2022     He has a history of testosterone deficiency and is on  testosterone replacement, taking 400 mg every 2 weeks, last shot was 8 days ago. He states that the testosterone helps with his energy, libido, muscle mass. Lab Results  Component Value Date   TESTOSTERONE 979 (H) 04/09/2022   He is on hytrin for BP and LUTS; nocturia x 2 most nights Lab Results  Component Value Date   PSA 0.44 03/06/2021   PSA 0.45 02/20/2020   PSA 0.5 05/12/2018     Medication Review:  Current Outpatient Medications (Endocrine & Metabolic):    testosterone cypionate (DEPOTESTOSTERONE CYPIONATE) 200 MG/ML injection, INJECT INTO THE MUSCLE EVERY WEEK  Current Outpatient Medications (Cardiovascular):    rosuvastatin (CRESTOR) 20 MG tablet, Take  1 tablet  Daily  for Cholesterol                                                 /                                 TAKE  BY                          MOUTH                      ONCE DAILY   terazosin (HYTRIN) 10 MG capsule, TAKE 1 CAPSULE BY MOUTH AT BEDTIME FOR PROSTATE  Current Outpatient Medications (Respiratory):    oxymetazoline (AFRIN) 0.05 % nasal spray, Place 1 spray into both nostrils 2 (two) times daily.  Current Outpatient Medications (Analgesics):    meloxicam (MOBIC) 15 MG tablet, Take  1/2 to 1 tablet  Daily  with Food  for Pain & Inflammation & limit to 5 days /week to avoid  Kidney Damage                                       /                                   TAKE                                                       BY                                     MOUTH   Current Outpatient Medications (Other):    cholecalciferol (VITAMIN D) 1000 units tablet, Take 1,000 Units by mouth daily.   co-enzyme Q-10 30 MG capsule, Take 30 mg by mouth 3 (three) times daily.   Omega-3 Fatty Acids (FISH OIL) 1200 MG CAPS, Take by mouth 3 (three) times daily.   pantoprazole (PROTONIX) 40 MG tablet, TAKE 1 TABLET BY MOUTH ONCE DAILY TO  PREVENT  HEARTBURN  AND  INDIGESTION   SYRINGE-NEEDLE, DISP, 3  ML (B-D 3CC LUER-LOK SYR 21GX1") 21G X 1" 3 ML MISC, USE INTRAMUSCULARLY EVERY 7 DAYS AS DIRECTED   metoCLOPramide (REGLAN) 5 MG tablet, Take 1 tablet (5 mg total) 1 hour before starting evening dose of bowel preparation, take 1 tablet (5 mg total)an hour before morning dose of bowel prep (Patient not taking: Reported on 08/08/2022)   OVER THE COUNTER MEDICATION, Takes allergy tablets PRN (Patient not taking: Reported on 08/08/2022)  Allergies: No Known Allergies  Current Problems (verified) has Labile hypertension; Hyperlipidemia, mixed; Vitamin D deficiency; Testosterone deficiency; Chronic pain due to trauma; BMI 26.0-26.9,adult; Left anterior fascicular block (LAFB); Acid reflux; and CKD (chronic kidney disease) stage 2, GFR 60-89 ml/min on their problem list.  Screening Tests  There is no immunization history on file for this patient.  Preventative care:  Last colonoscopy: 06/2021 Last cologuard: 02/2021 Positive CXR 11/2017, normal, remote former light smoker    Prior vaccinations: TD or Tdap: has had in last 10 years, declines Influenza: declines  Pneumococcal: declines Prevnar13: declines Shingles/Zostavax: declines   Names of Other Physician/Practitioners you currently use: 1. [facility] here for primary care 2. Eye doctor, last visit remote, has 70 year old glasses, patient will schedule to follow up  3. Dentist -full dentures  4.  Dermatology -  no concerns does not follow - defers  Patient Care Team: Lucky Cowboy, MD as PCP - General (Internal Medicine)  Surgical: He  has a past surgical history that includes Tonsillectomy; leg fracture (Right); left leg surgery; and Multiple tooth extractions. Family His family history is not on file. Social history  He reports that he has quit smoking. He has never used smokeless tobacco. He reports current drug use. Drug: Marijuana. He reports that he does not drink alcohol.  MEDICARE  WELLNESS OBJECTIVES: Physical activity:   Cardiac risk factors:   Depression/mood screen:      08/08/2022   12:29 PM  Depression screen PHQ 2/9  Decreased Interest 0  Down, Depressed, Hopeless 0  PHQ - 2 Score 0    ADLs:     08/08/2022   12:30 PM 03/09/2022   11:34 PM  In your present state of health, do you have any difficulty performing the following activities:  Hearing? 0 0  Vision? 0 0  Difficulty concentrating or making decisions? 0 0  Walking or climbing stairs? 0 0  Dressing or bathing? 0 0  Doing errands, shopping? 0 0     Cognitive Testing  Alert? Yes  Normal Appearance?Yes  Oriented to person? Yes  Place? Yes   Time? Yes  Recall of three objects?  Yes  Can perform simple calculations? Yes  Displays appropriate judgment?Yes  Can read the correct time from a watch face?Yes  EOL planning: Does Patient Have a Medical Advance Directive?: No Would patient like information on creating a medical advance directive?: No - Patient declined   Objective:   Today's Vitals   08/08/22 1115  BP: 124/70  Pulse: 71  Temp: 98.5 F (36.9 C)  SpO2: 99%  Weight: 174 lb 6.4 oz (79.1 kg)  Height: 5\' 9"  (1.753 m)   Body mass index is 25.75 kg/m.  General appearance: alert, no distress, WD/WN, male HEENT: normocephalic, sclerae anicteric, TMs pearly, nares patent, no discharge or erythema, pharynx normal Oral cavity: MMM, no lesions Neck: supple, no lymphadenopathy, no thyromegaly, no masses Heart: RRR, normal S1, S2, no murmurs Lungs: CTA bilaterally, no wheezes, rhonchi, or rales Abdomen: +bs, soft, non tender, non distended, no masses, no hepatomegaly, no splenomegaly Musculoskeletal: nontender, no swelling, no obvious deformity Extremities: no edema, no cyanosis, no clubbing Pulses: 2+ symmetric, upper and lower extremities, normal cap refill Neurological: alert, oriented x 3, CN2-12 intact, strength normal upper extremities and lower extremities, sensation normal  throughout, DTRs 2+ throughout, no cerebellar signs, gait normal Psychiatric: normal affect, behavior normal, pleasant   Medicare Attestation I have personally reviewed: The patient's medical and social history Their use of alcohol, tobacco or illicit drugs Their current medications and supplements The patient's functional ability including ADLs,fall risks, home safety risks, cognitive, and hearing and visual impairment Diet and physical activities Evidence for depression or mood disorders  The patient's weight, height, BMI, and visual acuity have been recorded in the chart.  I have made referrals, counseling, and provided education to the patient based on review of the above and I have provided the patient with a written personalized care plan for preventive services.     Adela Glimpse, NP   08/08/2022

## 2022-08-09 LAB — CBC WITH DIFFERENTIAL/PLATELET
Absolute Monocytes: 612 cells/uL (ref 200–950)
Basophils Absolute: 42 cells/uL (ref 0–200)
Basophils Relative: 0.7 %
Eosinophils Absolute: 210 cells/uL (ref 15–500)
Eosinophils Relative: 3.5 %
HCT: 45.5 % (ref 38.5–50.0)
Hemoglobin: 15.3 g/dL (ref 13.2–17.1)
Lymphs Abs: 1572 cells/uL (ref 850–3900)
MCH: 31 pg (ref 27.0–33.0)
MCHC: 33.6 g/dL (ref 32.0–36.0)
MCV: 92.1 fL (ref 80.0–100.0)
MPV: 9.5 fL (ref 7.5–12.5)
Monocytes Relative: 10.2 %
Neutro Abs: 3564 cells/uL (ref 1500–7800)
Neutrophils Relative %: 59.4 %
Platelets: 317 10*3/uL (ref 140–400)
RBC: 4.94 10*6/uL (ref 4.20–5.80)
RDW: 12.2 % (ref 11.0–15.0)
Total Lymphocyte: 26.2 %
WBC: 6 10*3/uL (ref 3.8–10.8)

## 2022-08-09 LAB — COMPLETE METABOLIC PANEL WITH GFR
AG Ratio: 1.7 (calc) (ref 1.0–2.5)
ALT: 22 U/L (ref 9–46)
AST: 23 U/L (ref 10–35)
Albumin: 4.4 g/dL (ref 3.6–5.1)
Alkaline phosphatase (APISO): 64 U/L (ref 35–144)
BUN: 14 mg/dL (ref 7–25)
CO2: 28 mmol/L (ref 20–32)
Calcium: 9.9 mg/dL (ref 8.6–10.3)
Chloride: 101 mmol/L (ref 98–110)
Creat: 1.05 mg/dL (ref 0.70–1.35)
Globulin: 2.6 g/dL (calc) (ref 1.9–3.7)
Glucose, Bld: 95 mg/dL (ref 65–99)
Potassium: 5.3 mmol/L (ref 3.5–5.3)
Sodium: 139 mmol/L (ref 135–146)
Total Bilirubin: 1.1 mg/dL (ref 0.2–1.2)
Total Protein: 7 g/dL (ref 6.1–8.1)
eGFR: 77 mL/min/{1.73_m2} (ref >=60)

## 2022-08-09 LAB — LIPID PANEL
Cholesterol: 144 mg/dL (ref <200)
HDL: 36 mg/dL — ABNORMAL LOW (ref >=40)
LDL Cholesterol (Calc): 87 mg/dL (calc)
Non-HDL Cholesterol (Calc): 108 mg/dL (calc) (ref <130)
Total CHOL/HDL Ratio: 4 (calc) (ref <5.0)
Triglycerides: 118 mg/dL (ref <150)

## 2022-08-09 LAB — VITAMIN D 25 HYDROXY (VIT D DEFICIENCY, FRACTURES): Vit D, 25-Hydroxy: 55 ng/mL (ref 30–100)

## 2022-08-12 ENCOUNTER — Other Ambulatory Visit: Payer: Self-pay | Admitting: Nurse Practitioner

## 2022-08-12 DIAGNOSIS — G8921 Chronic pain due to trauma: Secondary | ICD-10-CM

## 2022-08-12 DIAGNOSIS — N138 Other obstructive and reflux uropathy: Secondary | ICD-10-CM

## 2022-08-12 MED ORDER — MELOXICAM 15 MG PO TABS
ORAL_TABLET | ORAL | 0 refills | Status: DC
Start: 2022-08-12 — End: 2023-03-03

## 2022-08-14 ENCOUNTER — Other Ambulatory Visit: Payer: Self-pay | Admitting: Nurse Practitioner

## 2022-08-14 ENCOUNTER — Ambulatory Visit: Payer: PPO | Admitting: Sports Medicine

## 2022-08-14 DIAGNOSIS — N138 Other obstructive and reflux uropathy: Secondary | ICD-10-CM

## 2022-08-21 ENCOUNTER — Other Ambulatory Visit (INDEPENDENT_AMBULATORY_CARE_PROVIDER_SITE_OTHER): Payer: HMO

## 2022-08-21 ENCOUNTER — Encounter: Payer: Self-pay | Admitting: Sports Medicine

## 2022-08-21 ENCOUNTER — Ambulatory Visit (INDEPENDENT_AMBULATORY_CARE_PROVIDER_SITE_OTHER): Payer: HMO | Admitting: Sports Medicine

## 2022-08-21 DIAGNOSIS — M25511 Pain in right shoulder: Secondary | ICD-10-CM

## 2022-08-21 DIAGNOSIS — G8929 Other chronic pain: Secondary | ICD-10-CM

## 2022-08-21 DIAGNOSIS — M25512 Pain in left shoulder: Secondary | ICD-10-CM

## 2022-08-21 DIAGNOSIS — M67922 Unspecified disorder of synovium and tendon, left upper arm: Secondary | ICD-10-CM

## 2022-08-21 DIAGNOSIS — M19012 Primary osteoarthritis, left shoulder: Secondary | ICD-10-CM

## 2022-08-21 NOTE — Progress Notes (Addendum)
Christian Dixon - 72 y.o. male MRN [id number]  Date of birth: 05/12/1950  Office Visit Note: Visit Date: 08/21/2022 PCP: Lucky Cowboy, MD Referred by: Adela Glimpse, NP  Subjective: Chief Complaint  Patient presents with   Left Shoulder - Pain   HPI: Christian Dixon is a pleasant 72 y.o. male who presents today for chronic left anterior shoulder and bicep pain.   He has had pain over the anterior aspect of the shoulder and the proximal bicep for at least 3 months or more.  Denies any specific injury, has not had any pop or snapping sensation.  He does feel this when he is working out specifically with bicep curls or when he is performing supination exercises.  He is activating the biceps and feels like the left side will cramp up on him.  Does feel his left side is not as strong as the right.  Also has some pain of the right shoulder, but has been taking meloxicam 15 mg every other day and it has loosened up to some degree for him.  Currently his left is more bothersome.  He also mentions today he had bone removal and shortening of his right leg and has been in a foot mold/elevation on his shoe for years.  He is asking about seeing someone for this and seeing if he needs a new prosthesis/shoe mold.   Lab reviewed from 04/09/2022: A1c 5.5, vitamin D 53, serum creatinine 1.05.  Pertinent ROS were reviewed with the patient and found to be negative unless otherwise specified above in HPI.   Assessment & Plan: Visit Diagnoses:  1. Tendinopathy of left biceps tendon   2. Chronic left shoulder pain   3. Chronic pain of both shoulders   4. Primary osteoarthritis, left shoulder    Plan: Discussed with Casimiro Needle treatment option for both shoulders but his left one which is more bothersome.  He does have evidence of likely partial split tearing of the proximal bicep tendon, however he is completely intact distally without any symptoms.  His x-ray does show some moderate arthritic change  within the shoulder as well which could be contributing slightly.  I would like him to rest from biceps related lifting and activity as well as eccentric loading with the hand in a supinated position for the next 2-3 weeks to see if this will settle down.  He is on meloxicam 15 mg which he takes every other day, he will continue this for a few weeks and then after that may discontinue from my perspective.  We will get him started in formalized physical therapy, I think he only needs a few sessions to give him some rehab exercises to help strengthen the biceps and support the shoulders.  We discussed the role for biceps tendon sheath injection or even glenohumeral joint injection on the left, we will hold on this for now which I feel is very reasonable.  He will follow-up in about 6 weeks if this is not improving.  Additional treatment considerations may be MRI of the left shoulder, start with x-rays of the right shoulder if not continue to improve.  He did also mention issues with a prior surgery in the shoulder and bone of the right leg.  He had a prosthesis mold was made years ago, did recommend he make an appointment to see Dr. Lajoyce Corners if he would like to address this.  Follow-up: Return in about 6 weeks (around 10/02/2022), or if symptoms worsen or fail to improve.  Meds & Orders: No orders of the defined types were placed in this encounter.   Orders Placed This Encounter  Procedures   [place] Extrem Low Left Ltd   XR Shoulder Left   Ambulatory referral to Physical Therapy     Procedures: No procedures performed      Clinical History: No specialty comments available.  He reports that he has quit smoking. He has never used smokeless tobacco.  Recent Labs    04/09/22 1518  HGBA1C 5.5    Objective:    Physical Exam  Gen: Well-appearing, in no acute distress; non-toxic CV: Well-perfused. Warm.  Resp: Breathing unlabored on room air; no wheezing. Psych: Fluid speech in conversation;  appropriate affect; normal thought process Neuro: Sensation intact throughout. No gross coordination deficits.   Ortho Exam -Bilateral shoulder: There is full and active range of motion of bilateral shoulders in flexion and abduction.  There is tenderness to palpation within the bicipital groove and the proximal bicep tendon anteriorly.  The left bicep is slightly more atrophied than the right which is well-formed.  There is no true Popeye or reverse Popeye deformity.  No AC joint TTP.  There is good strength of bilateral shoulders in all directions. + Mildly positive speeds test on the left.  Negative Hawkins impingement testing.  Imaging: [place] Extrem Low Left Ltd  Result Date: 08/21/2022 Limited musculoskeletal ultrasound of the left upper extremity, left shoulder was performed today.  Short and long axis view of the bicep tendon was evaluated with the bicep tendon well-seated within the humeral groove.  Just at and distal to the bicipital groove there is evidence of partial tearing with a likely split tear of the biceps tendon.  Following this dynamically into the bicep muscle, there is intact tendon without any evidence of high-grade tearing.  No cortical irregularity of the humerus noted.  Underlying subscapularis without significant pathology. **This was of the left upper extremity, incorrectly ordered as left lower extremity.  XR Shoulder Left  Result Date: 08/21/2022 3 views of the left shoulder including Grashey, scapular Y and axial view were ordered and reviewed by myself.  X-rays demonstrate at least moderate glenohumeral joint arthritis, there is a slightly higher riding humeral head.  Moderate to severe AC joint narrowing.  No acute fracture or otherwise bony abnormality noted.   Past Medical/Family/Surgical/Social History: Medications & Allergies reviewed per EMR, new medications updated. Patient Active Problem List   Diagnosis Date Noted   CKD (chronic kidney disease) stage 2, GFR  60-89 ml/min 03/09/2022   Acid reflux 10/13/2018   Left anterior fascicular block (LAFB) 10/05/2017   Labile hypertension 09/30/2017   Hyperlipidemia, mixed 09/30/2017   Vitamin D deficiency 09/30/2017   Testosterone deficiency 09/30/2017   Chronic pain due to trauma 09/30/2017   BMI 26.0-26.9,adult 09/30/2017   Past Medical History:  Diagnosis Date   Allergy    mild in winter with heat   GERD (gastroesophageal reflux disease)    on meds   Hyperlipidemia    on meds   Family History  Problem Relation Age of Onset   Colon cancer Neg Hx    Colon polyps Neg Hx    Esophageal cancer Neg Hx    Rectal cancer Neg Hx    Stomach cancer Neg Hx    Past Surgical History:  Procedure Laterality Date   left leg surgery     plate placed   leg fracture Right    from accident   MULTIPLE TOOTH EXTRACTIONS  no sedation   TONSILLECTOMY     Social History   Occupational History   Not on file  Tobacco Use   Smoking status: Former   Smokeless tobacco: Never  Building services engineer Use: Never used  Substance and Sexual Activity   Alcohol use: No   Drug use: Yes    Types: Marijuana    Comment: used early this morning   Sexual activity: Not on file

## 2022-08-21 NOTE — Progress Notes (Signed)
xxLeft arm/ bicep pain Feels a "knot" Concerned about the lack of muscle in bicep States when he works out it "cramps up" on him and he cannot push thru the workout    Also complaining of right shoulder pain; months to 1 year of pain Meloxicam for pain Motion is ok No injection, no PT

## 2022-09-03 ENCOUNTER — Ambulatory Visit: Payer: HMO | Admitting: Physical Therapy

## 2022-09-07 NOTE — Therapy (Signed)
OUTPATIENT PHYSICAL THERAPY UPPER EXTREMITY EVALUATION   Patient Name: Christian Dixon MRN: [id number] DOB:08/24/1950, 72 y.o., male Today's Date: 09/07/2022  END OF SESSION:   Past Medical History:  Diagnosis Date   Allergy    mild in winter with heat   GERD (gastroesophageal reflux disease)    on meds   Hyperlipidemia    on meds   Past Surgical History:  Procedure Laterality Date   left leg surgery     plate placed   leg fracture Right    from accident   MULTIPLE TOOTH EXTRACTIONS     no sedation   TONSILLECTOMY     Patient Active Problem List   Diagnosis Date Noted   CKD (chronic kidney disease) stage 2, GFR 60-89 ml/min 03/09/2022   Acid reflux 10/13/2018   Left anterior fascicular block (LAFB) 10/05/2017   Labile hypertension 09/30/2017   Hyperlipidemia, mixed 09/30/2017   Vitamin D deficiency 09/30/2017   Testosterone deficiency 09/30/2017   Chronic pain due to trauma 09/30/2017   BMI 26.0-26.9,adult 09/30/2017    PCP: Lucky Cowboy, MD   REFERRING PROVIDER: Madelyn Brunner, DO   REFERRING DIAG: [phone] (ICD-10-CM) - Tendinopathy of left biceps tendon   THERAPY DIAG:  No diagnosis found.  Rationale for Evaluation and Treatment: Rehabilitation  ONSET DATE: ***  SUBJECTIVE:                                                                                                                                                                                      SUBJECTIVE STATEMENT: *** Hand dominance: {MISC; OT HAND DOMINANCE:[id number]}  PERTINENT HISTORY: ***  PAIN:  Are you having pain? Yes: NPRS scale: ***/10 Pain location: *** Pain description: *** Aggravating factors: *** Relieving factors: ***  PRECAUTIONS: Other: limit biceps strengthening and eccentric loading with supinated hand  WEIGHT BEARING RESTRICTIONS: No  FALLS:  Has patient fallen in last 6 months? {fallsyesno:27318}  LIVING ENVIRONMENT: Lives with: {[facility] lives  with:25569::"lives with their family"} Lives in: {Lives in:25570} Stairs: {opstairs:27293} Has following equipment at home: {Assistive devices:23999}  OCCUPATION: ***  PLOF: {PLOF:24004}  PATIENT GOALS: ***  NEXT MD VISIT: ***  OBJECTIVE:   DIAGNOSTIC FINDINGS:    [place] 08/21/22: Limited musculoskeletal ultrasound of the left upper extremity, left shoulder was performed today.  Short and long axis view of the bicep tendon was evaluated with the bicep tendon well-seated within the humeral groove.  Just at and distal to the bicipital groove there is evidence of partial tearing with a likely split tear of the biceps tendon.  Following  this dynamically into the bicep muscle, there is intact tendon without any  evidence of high-grade  tearing.  No cortical irregularity of the humerus  noted.  Underlying subscapularis without significant pathology.   X-rays 08/21/22 demonstrate at least moderate  glenohumeral joint arthritis, there is a slightly higher riding humeral  head.  Moderate to severe AC joint narrowing.  No acute fracture or  otherwise bony abnormality noted.   PATIENT SURVEYS :  FOTO ***  COGNITION: Overall cognitive status: {cognition:24006}     SENSATION: {sensation:27233}  POSTURE: ***  UPPER EXTREMITY ROM:   {AROM/PROM:27142} ROM Right eval Left eval  Shoulder flexion    Shoulder extension    Shoulder abduction    Shoulder adduction    Shoulder internal rotation    Shoulder external rotation    Elbow flexion    Elbow extension    Wrist flexion    Wrist extension    Wrist ulnar deviation    Wrist radial deviation    Wrist pronation    Wrist supination    (Blank rows = not tested)  UPPER EXTREMITY MMT:  MMT Right eval Left eval  Shoulder flexion    Shoulder extension    Shoulder abduction    Shoulder adduction    Shoulder internal rotation    Shoulder external rotation    Middle trapezius    Lower trapezius    Elbow flexion    Elbow  extension    Wrist flexion    Wrist extension    Wrist ulnar deviation    Wrist radial deviation    Wrist pronation    Wrist supination    Grip strength (lbs)    (Blank rows = not tested)  SHOULDER SPECIAL TESTS: Impingement tests: {shoulder impingement test:25231:a} SLAP lesions: {SLAP lesions:25232} Instability tests: {shoulder instability test:25233} Rotator cuff assessment: {rotator cuff assessment:25234} Biceps assessment: {biceps assessment:25235}  JOINT MOBILITY TESTING:  ***  PALPATION:  ***   TODAY'S TREATMENT:                                                                                                                                         DATE: 09/08/22 See pt ed and HEP  PATIENT EDUCATION: Education details: {Education details:27468}  Person educated: {Person educated:25204} Education method: {Education Method:25205} Education comprehension: {Education Comprehension:25206}  HOME EXERCISE PROGRAM: ***  ASSESSMENT:  CLINICAL IMPRESSION: Patient is a 72 y.o. male who was seen today for physical therapy evaluation and treatment for left shoulder pain.    OBJECTIVE IMPAIRMENTS: {opptimpairments:25111}.   ACTIVITY LIMITATIONS: {activitylimitations:27494}  PARTICIPATION LIMITATIONS: {participationrestrictions:25113}  PERSONAL FACTORS: {Personal factors:25162} are also affecting patient's functional outcome.   REHAB POTENTIAL: {rehabpotential:25112}  CLINICAL DECISION MAKING: {clinical decision making:25114}  EVALUATION COMPLEXITY: {Evaluation complexity:25115}  GOALS: Goals reviewed with patient? {yes/no:20286}  SHORT TERM GOALS: Target date: ***  *** Baseline: Goal status: {GOALSTATUS:25110}  2.  *** Baseline:  Goal status: {GOALSTATUS:25110}  3.  *** Baseline:  Goal status: {GOALSTATUS:25110}  4.  *** Baseline:  Goal status: {  GOALSTATUS:25110}  5.  *** Baseline:  Goal status: {GOALSTATUS:25110}  6.  *** Baseline:  Goal  status: {GOALSTATUS:25110}  LONG TERM GOALS: Target date: ***  *** Baseline:  Goal status: {GOALSTATUS:25110}  2.  *** Baseline:  Goal status: {GOALSTATUS:25110}  3.  *** Baseline:  Goal status: {GOALSTATUS:25110}  4.  *** Baseline:  Goal status: {GOALSTATUS:25110}  5.  *** Baseline:  Goal status: {GOALSTATUS:25110}  6.  *** Baseline:  Goal status: {GOALSTATUS:25110}  PLAN: PT FREQUENCY: {rehab frequency:25116}  PT DURATION: {rehab duration:25117}  PLANNED INTERVENTIONS: {rehab planned interventions:25118::"Therapeutic exercises","Therapeutic activity","Neuromuscular re-education","Balance training","Gait training","Patient/Family education","Self Care","Joint mobilization"}  PLAN FOR NEXT SESSION: ***   Daivd Fredericksen, PT 09/07/2022, 7:28 PM

## 2022-09-08 ENCOUNTER — Ambulatory Visit: Payer: HMO | Attending: Sports Medicine | Admitting: Physical Therapy

## 2022-09-08 ENCOUNTER — Encounter: Payer: Self-pay | Admitting: Physical Therapy

## 2022-09-08 ENCOUNTER — Other Ambulatory Visit: Payer: Self-pay

## 2022-09-08 DIAGNOSIS — R252 Cramp and spasm: Secondary | ICD-10-CM | POA: Insufficient documentation

## 2022-09-08 DIAGNOSIS — M67922 Unspecified disorder of synovium and tendon, left upper arm: Secondary | ICD-10-CM | POA: Diagnosis not present

## 2022-09-08 DIAGNOSIS — G8929 Other chronic pain: Secondary | ICD-10-CM | POA: Diagnosis not present

## 2022-09-08 DIAGNOSIS — M79622 Pain in left upper arm: Secondary | ICD-10-CM | POA: Diagnosis not present

## 2022-09-08 DIAGNOSIS — M25511 Pain in right shoulder: Secondary | ICD-10-CM | POA: Insufficient documentation

## 2022-09-09 NOTE — Therapy (Signed)
OUTPATIENT PHYSICAL THERAPY UPPER EXTREMITY TREATMENT   Patient Name: Christian Dixon MRN: [id number] DOB:07-23-1950, 72 y.o., male Today's Date: 09/10/2022  END OF SESSION:  PT End of Session - 09/10/22 1315     Visit Number 2    Number of Visits 8    Date for PT Re-Evaluation 10/06/22    Progress Note Due on Visit 10    PT Start Time 1315    PT Stop Time 1400    PT Time Calculation (min) 45 min    Activity Tolerance Patient tolerated treatment well    Behavior During Therapy [facility] for tasks assessed/performed              Past Medical History:  Diagnosis Date   Allergy    mild in winter with heat   GERD (gastroesophageal reflux disease)    on meds   Hyperlipidemia    on meds   Past Surgical History:  Procedure Laterality Date   left leg surgery     plate placed   leg fracture Right    from accident   MULTIPLE TOOTH EXTRACTIONS     no sedation   TONSILLECTOMY     Patient Active Problem List   Diagnosis Date Noted   CKD (chronic kidney disease) stage 2, GFR 60-89 ml/min 03/09/2022   Acid reflux 10/13/2018   Left anterior fascicular block (LAFB) 10/05/2017   Labile hypertension 09/30/2017   Hyperlipidemia, mixed 09/30/2017   Vitamin D deficiency 09/30/2017   Testosterone deficiency 09/30/2017   Chronic pain due to trauma 09/30/2017   BMI 26.0-26.9,adult 09/30/2017    PCP: Lucky Cowboy, MD   REFERRING PROVIDER: Madelyn Brunner, DO   REFERRING DIAG: [phone] (ICD-10-CM) - Tendinopathy of left biceps tendon   THERAPY DIAG:  Pain in left upper arm  Chronic right shoulder pain  Cramp and spasm  Rationale for Evaluation and Treatment: Rehabilitation  ONSET DATE: L shoulder - months; R shoulder months  SUBJECTIVE:                                                                                                                                                                                      SUBJECTIVE STATEMENT: Biceps feels tight. Hand  dominance: Right  PERTINENT HISTORY: unremarkable  PAIN:  Are you having pain? Yes: NPRS scale: 3-4/10 Pain location: lett biceps Pain description: cramp, soreness Aggravating factors: working out with biceps curls, doing drywall work Relieving factors: rest  PAIN:  Are you having pain? Yes: NPRS scale: 5-6/10 Pain location: R shoulder  Pain description: sharp jab Aggravating factors: overhead activity Relieving factors: rest   PRECAUTIONS: Other: from MD to pt: limit biceps strengthening  and eccentric loading with supinated hand  WEIGHT BEARING RESTRICTIONS: No  FALLS:  Has patient fallen in last 6 months? No  LIVING ENVIRONMENT: Lives with: lives with their spouse Lives in: House/apartment Stairs:  N/A Has following equipment at home: None  OCCUPATION: retired  PLOF: Independent  PATIENT GOALS: would like to be able to train again  NEXT MD VISIT: none scheduled  OBJECTIVE:   DIAGNOSTIC FINDINGS:    [place] 08/21/22: Limited musculoskeletal ultrasound of the left upper extremity, left shoulder was performed today.  Short and long axis view of the bicep tendon was evaluated with the bicep tendon well-seated within the humeral groove.  Just at and distal to the bicipital groove there is evidence of partial tearing with a likely split tear of the biceps tendon.  Following  this dynamically into the bicep muscle, there is intact tendon without any  evidence of high-grade tearing.  No cortical irregularity of the humerus  noted.  Underlying subscapularis without significant pathology.   X-rays 08/21/22 demonstrate at least moderate  glenohumeral joint arthritis, there is a slightly higher riding humeral  head.  Moderate to severe AC joint narrowing.  No acute fracture or  otherwise bony abnormality noted.   PATIENT SURVEYS :  FOTO TBD  COGNITION: Overall cognitive status: Within functional limits for tasks assessed     SENSATION: WFL  POSTURE: Rounded shoulders  L> R, depressed R shoulder secondary to R lower leg injury years ao  UPPER EXTREMITY ROM: Functional IR thumb to L4 on R; L 6 inches higher  A/P ROM Right eval Left eval  Shoulder flexion 150/156* 167  Shoulder extension    Shoulder abduction 143/147* 180  Shoulder adduction    Shoulder internal rotation 57/70* 80  Shoulder external rotation 68* 70 cramping  Elbow flexion 136 142  Elbow extension  full  (Blank rows = not tested)  UPPER EXTREMITY MMT: R shoulder and elbow 5/5, L shoulder flex/ABD/ext 4+/5, ER 5-/5, IR 5/5, L biceps spasms with increased resistance, triceps 5/5  SHOULDER SPECIAL TESTS: Impingement tests: Neer impingement test: negative, Hawkins/Kennedy impingement test: negative, and Painful arc test: positive  R shoulder Rotator cuff assessment: Empty can test: negative, Full can test: negative, and Gerber lift off test: negative R shoulder Biceps assessment:  cramping felt with all resistive biceps testing   PALPATION:  Left biceps muscle, insertion, LH and SH origins. R ant shoulder   TODAY'S TREATMENT:                                                                                                                                         DATE:  09/10/22 UBE L3 x 4 min alt every min Left biceps stretch at wall 2x30 sec Standing R shoulder flexion on wall x 10 SDLY 3# ER, flex to 100 deg and horizontal ABD x 10  BFR: UOP 190 mmHg, 30% 57 mmHG Biceps  curl x 30, 3x15 with 60 sec rest  Manual: IASTM to left biceps  09/08/22 See pt ed and HEP  PATIENT EDUCATION: Education details: HEP update  Person educated: Patient Education method: Explanation, Demonstration, Verbal cues, and Handouts Education comprehension: verbalized understanding and returned demonstration  HOME EXERCISE PROGRAM: Access Code: [email or web address] URL: [email or web address] Date: 09/10/2022 Prepared by: Raynelle Fanning  Exercises - Seated Elbow Flexion AAROM (Mirrored)  - 1 x daily -  3-4 x weekly - 1-3 sets - 10 reps - 3 sec hold - Standing Bicep Stretch at Wall  - 2 x daily - 7 x weekly - 1 sets - 3 reps - 30 sec hold - Standing Single Shoulder Flexion Wall Slide with Palm Up (Mirrored)  - 1 x daily - 7 x weekly - 1 sets - 10 reps  ASSESSMENT:  CLINICAL IMPRESSION: Kathlene November tolerated BFR well without increased pain. He responded well to biceps stretching and R shoulder ROM and strengthening.    OBJECTIVE IMPAIRMENTS: decreased activity tolerance, decreased ROM, decreased strength, increased muscle spasms, impaired flexibility, impaired UE functional use, postural dysfunction, and pain.   ACTIVITY LIMITATIONS: carrying, lifting, reach over head, and working out  PARTICIPATION LIMITATIONS: community activity  PERSONAL FACTORS: Age are also affecting patient's functional outcome.   REHAB POTENTIAL: Good  CLINICAL DECISION MAKING: Stable/uncomplicated  EVALUATION COMPLEXITY: Low  GOALS: Goals reviewed with patient? Yes  SHORT TERM GOALS: Target date: 09/22/2022   Patient will be independent with initial HEP.  Baseline:  Goal status: INITIAL    LONG TERM GOALS: Target date: 10/06/2022   Patient will be independent with advanced/ongoing HEP to improve outcomes and carryover.  Baseline:  Goal status: INITIAL  2.  Patient will report 75% improvement in B shoulder pain to improve QOL.  Baseline:  Goal status: INITIAL  3.  Patient able to use left arm to lift and carry without spasm in the left biceps. Baseline:  Goal status: INITIAL  4.  Patient to improve R shoulder AROM to WNL without pain provocation to allow for increased ease of ADLs.  Baseline: flex, IR and ABD limited Goal status: INITIAL    PLAN: PT FREQUENCY: 2x/week  PT DURATION: 4 weeks  PLANNED INTERVENTIONS: Therapeutic exercises, Therapeutic activity, Neuromuscular re-education, Patient/Family education, Self Care, Joint mobilization, Dry Needling, Electrical stimulation, Cryotherapy,  Moist heat, Taping, Ultrasound, Ionotophoresis 4mg /ml Dexamethasone, and Manual therapy  PLAN FOR NEXT SESSION: Assess response to BFR and continue as tolerated, . L shoulder: work on low load biceps strengthening (see precautions), manual/DN?, R shoulder - ionto to ant shoulder, work on ROM   Alleah Dearman, PT 09/10/2022, 3:19 PM

## 2022-09-10 ENCOUNTER — Ambulatory Visit: Payer: HMO | Admitting: Physical Therapy

## 2022-09-10 ENCOUNTER — Encounter: Payer: Self-pay | Admitting: Physical Therapy

## 2022-09-10 DIAGNOSIS — M79622 Pain in left upper arm: Secondary | ICD-10-CM | POA: Diagnosis not present

## 2022-09-10 DIAGNOSIS — R252 Cramp and spasm: Secondary | ICD-10-CM

## 2022-09-10 DIAGNOSIS — G8929 Other chronic pain: Secondary | ICD-10-CM

## 2022-09-16 ENCOUNTER — Encounter: Payer: Self-pay | Admitting: Rehabilitative and Restorative Service Providers"

## 2022-09-16 ENCOUNTER — Ambulatory Visit: Payer: HMO | Admitting: Rehabilitative and Restorative Service Providers"

## 2022-09-16 DIAGNOSIS — M79622 Pain in left upper arm: Secondary | ICD-10-CM

## 2022-09-16 DIAGNOSIS — R252 Cramp and spasm: Secondary | ICD-10-CM

## 2022-09-16 DIAGNOSIS — G8929 Other chronic pain: Secondary | ICD-10-CM

## 2022-09-16 NOTE — Therapy (Addendum)
 OUTPATIENT PHYSICAL THERAPY UPPER EXTREMITY TREATMENT PHYSICAL THERAPY DISCHARGE SUMMARY  Visits from Start of Care: 3  Current functional level related to goals / functional outcomes: See progress note for discharge status    Remaining deficits: Unknown    Education / Equipment: HEP    Patient agrees to discharge. Patient goals were partially met. Patient is being discharged due to not returning since the last visit.  Christian Dixon P. Christian Dixon PT, MPH 06/08/23 10:18 AM     Patient Name: Christian Dixon MRN: [id number] DOB:1950-11-20, 72 y.o., male Today's Date: 09/16/2022  END OF SESSION:  PT End of Session - 09/16/22 1408     Visit Number 3    Number of Visits 8    Date for PT Re-Evaluation 10/06/22    Authorization Type HTA    Progress Note Due on Visit 10    PT Start Time 1400    PT Stop Time 1445    PT Time Calculation (min) 45 min    Activity Tolerance Patient tolerated treatment well              Past Medical History:  Diagnosis Date   Allergy    mild in winter with heat   GERD (gastroesophageal reflux disease)    on meds   Hyperlipidemia    on meds   Past Surgical History:  Procedure Laterality Date   left leg surgery     plate placed   leg fracture Right    from accident   MULTIPLE TOOTH EXTRACTIONS     no sedation   TONSILLECTOMY     Patient Active Problem List   Diagnosis Date Noted   CKD (chronic kidney disease) stage 2, GFR 60-89 ml/min 03/09/2022   Acid reflux 10/13/2018   Left anterior fascicular block (LAFB) 10/05/2017   Labile hypertension 09/30/2017   Hyperlipidemia, mixed 09/30/2017   Vitamin D deficiency 09/30/2017   Testosterone deficiency 09/30/2017   Chronic pain due to trauma 09/30/2017   BMI 26.0-26.9,adult 09/30/2017    PCP: Lucky Cowboy, MD   REFERRING PROVIDER: Madelyn Brunner, DO   REFERRING DIAG: [id number] (ICD-10-CM) - Tendinopathy of left biceps tendon   THERAPY DIAG:  Pain in left upper arm  Chronic right  shoulder pain  Cramp and spasm  Rationale for Evaluation and Treatment: Rehabilitation  ONSET DATE: L shoulder - months; R shoulder months  SUBJECTIVE:                                                                                                                                                                                      SUBJECTIVE STATEMENT: Patient states that he has not eaten today but  will try therapy and "see how it goes". L side feels tight in the biceps up closer to the shoulder. Could not tell a difference with the BFR at last visit. R shoulder is tight and he can tell the shoulder is moving a bit better. Does not feel he needs strengthening exercises for the R shoulder.   Hand dominance: Right  PERTINENT HISTORY: unremarkable  PAIN:  Are you having pain? Yes: NPRS scale: 1/10 Pain location: lett biceps Pain description: cramp, soreness Aggravating factors: working out with biceps curls, doing drywall work Relieving factors: rest  PAIN:  Are you having pain? Yes: NPRS scale: 2/10 Pain location: R shoulder  Pain description: sharp jab Aggravating factors: overhead activity or movement  Relieving factors: rest   PRECAUTIONS: Other: from MD to pt: limit biceps strengthening and eccentric loading with supinated hand  WEIGHT BEARING RESTRICTIONS: No  FALLS:  Has patient fallen in last 6 months? No  LIVING ENVIRONMENT: Lives with: lives with their spouse Lives in: House/apartment Stairs:  N/A Has following equipment at home: None  OCCUPATION: retired  PLOF: Independent  PATIENT GOALS: would like to be able to train again  NEXT MD VISIT: none scheduled  OBJECTIVE:   DIAGNOSTIC FINDINGS:    [place] 08/21/22: Limited musculoskeletal ultrasound of the left upper extremity, left shoulder was performed today.  Short and long axis view of the bicep tendon was evaluated with the bicep tendon well-seated within the humeral groove.  Just at and distal to the  bicipital groove there is evidence of partial tearing with a likely split tear of the biceps tendon.  Following  this dynamically into the bicep muscle, there is intact tendon without any  evidence of high-grade tearing.  No cortical irregularity of the humerus  noted.  Underlying subscapularis without significant pathology.   X-rays 08/21/22 demonstrate at least moderate  glenohumeral joint arthritis, there is a slightly higher riding humeral  head.  Moderate to severe AC joint narrowing.  No acute fracture or  otherwise bony abnormality noted.   PATIENT SURVEYS :  FOTO TBD  COGNITION: Overall cognitive status: Within functional limits for tasks assessed     SENSATION: WFL  POSTURE: Rounded shoulders L> R, depressed R shoulder secondary to R lower leg injury years ao  UPPER EXTREMITY ROM: Functional IR thumb to L4 on R; L 6 inches higher  A/P ROM Right eval Left eval  Shoulder flexion 150/156* 167  Shoulder extension    Shoulder abduction 143/147* 180  Shoulder adduction    Shoulder internal rotation 57/70* 80  Shoulder external rotation 68* 70 cramping  Elbow flexion 136 142  Elbow extension  full  (Blank rows = not tested)  UPPER EXTREMITY MMT: R shoulder and elbow 5/5, L shoulder flex/ABD/ext 4+/5, ER 5-/5, IR 5/5, L biceps spasms with increased resistance, triceps 5/5  SHOULDER SPECIAL TESTS: Impingement tests: Neer impingement test: negative, Hawkins/Kennedy impingement test: negative, and Painful arc test: positive  R shoulder Rotator cuff assessment: Empty can test: negative, Full can test: negative, and Gerber lift off test: negative R shoulder Biceps assessment:  cramping felt with all resistive biceps testing   PALPATION:  Left biceps muscle, insertion, LH and SH origins. R ant shoulder   TODAY'S TREATMENT:  DATE:   09/16/22 UBE L6 x 4 min alt every min Left biceps stretch at wall 2x30 sec Standing R shoulder flexion on wall x 10 Scap squeeze L's green TB 10 x 2  W - shoulder ER standing green TB 10 x 2  Biceps curl red TB 10 x 2  Hammer curl red TB 10  Shoulder flexion to 90 deg red TB 10 x 2   TODAY'S TREATMENT:                                                                                                                                         DATE:  09/10/22 UBE L3 x 4 min alt every min Left biceps stretch at wall 2x30 sec Standing R shoulder flexion on wall x 10 SDLY 3# ER, flex to 100 deg and horizontal ABD x 10  BFR: UOP 190 mmHg, 30% 57 mmHG Biceps curl x 30, 3x15 with 60 sec rest  Manual: IASTM to left biceps  09/08/22 See pt ed and HEP  PATIENT EDUCATION: Education details: HEP update  Person educated: Patient Education method: Explanation, Demonstration, Verbal cues, and Handouts Education comprehension: verbalized understanding and returned demonstration  HOME EXERCISE PROGRAM: Access Code: [email or web address] URL: [email or web address] Date: 09/16/2022 Prepared by: Corlis Leak  Exercises - Seated Elbow Flexion AAROM (Mirrored)  - 1 x daily - 3-4 x weekly - 1-3 sets - 10 reps - 3 sec hold - Standing Bicep Stretch at Wall  - 2 x daily - 7 x weekly - 1 sets - 3 reps - 30 sec hold - Standing Single Shoulder Flexion Wall Slide with Palm Up (Mirrored)  - 1 x daily - 7 x weekly - 1 sets - 10 reps - Shoulder External Rotation and Scapular Retraction with Resistance  - 2 x daily - 7 x weekly - 1 sets - 10 reps - 3-5 sec  hold - Shoulder W - External Rotation with Resistance  - 2 x daily - 7 x weekly - 1-2 sets - 10 reps - 3 sec  hold - Standing Bicep Curls with Resistance  - 2 x daily - 7 x weekly - 1 sets - 3 reps - 30 sec  hold - Standing Bicep Curls Neutral with Dumbbells  - 2 x daily - 7 x weekly - 2 sets - 10 reps - 2-3 sec  hold - Standing Single Arm Shoulder Flexion  with Posterior Anchored Resistance  - 2 x daily - 7 x weekly - 2 sets - 10 reps - 2-3 sec  hold  ASSESSMENT:  CLINICAL IMPRESSION: Reviewed and progressed exercises. Added resistive exercises using TB for HEP.  Will continue L biceps stretching and shoulder ROM and strengthening.    OBJECTIVE IMPAIRMENTS: decreased activity tolerance, decreased ROM, decreased strength, increased muscle spasms, impaired flexibility, impaired UE functional use, postural dysfunction, and pain.   GOALS: Goals reviewed with patient? Yes  SHORT TERM GOALS: Target date: 09/22/2022   Patient will be independent with initial HEP.  Baseline:  Goal status: INITIAL    LONG TERM GOALS: Target date: 10/06/2022   Patient will be independent with advanced/ongoing HEP to improve outcomes and carryover.  Baseline:  Goal status: INITIAL  2.  Patient will report 75% improvement in B shoulder pain to improve QOL.  Baseline:  Goal status: INITIAL  3.  Patient able to use left arm to lift and carry without spasm in the left biceps. Baseline:  Goal status: INITIAL  4.  Patient to improve R shoulder AROM to WNL without pain provocation to allow for increased ease of ADLs.  Baseline: flex, IR and ABD limited Goal status: INITIAL    PLAN: PT FREQUENCY: 2x/week  PT DURATION: 4 weeks  PLANNED INTERVENTIONS: Therapeutic exercises, Therapeutic activity, Neuromuscular re-education, Patient/Family education, Self Care, Joint mobilization, Dry Needling, Electrical stimulation, Cryotherapy, Moist heat, Taping, Ultrasound, Ionotophoresis 4mg /ml Dexamethasone, and Manual therapy  PLAN FOR NEXT SESSION: continue BFR as indicated L shoulder: work on low load biceps strengthening (see precautions), manual/DN?, R shoulder - ionto to ant shoulder ?, work on [employer], PT 09/16/2022, 2:09 PM

## 2022-09-18 ENCOUNTER — Ambulatory Visit: Payer: HMO

## 2022-09-22 ENCOUNTER — Ambulatory Visit: Payer: HMO | Admitting: Rehabilitative and Restorative Service Providers"

## 2022-09-24 ENCOUNTER — Ambulatory Visit: Payer: HMO | Admitting: Rehabilitative and Restorative Service Providers"

## 2022-10-16 ENCOUNTER — Other Ambulatory Visit: Payer: Self-pay | Admitting: Nurse Practitioner

## 2022-11-09 ENCOUNTER — Encounter: Payer: Self-pay | Admitting: Internal Medicine

## 2022-11-09 ENCOUNTER — Other Ambulatory Visit: Payer: Self-pay | Admitting: Internal Medicine

## 2022-11-09 ENCOUNTER — Other Ambulatory Visit: Payer: Self-pay | Admitting: Nurse Practitioner

## 2022-11-09 DIAGNOSIS — N138 Other obstructive and reflux uropathy: Secondary | ICD-10-CM

## 2022-11-09 NOTE — Patient Instructions (Signed)

## 2022-11-09 NOTE — Progress Notes (Unsigned)
Annual  Screening/Preventative Visit  & Comprehensive Evaluation & Examination   Future Appointments  Date Time Provider Department  11/10/2022                       cpe  2:00 PM Lucky Cowboy, MD [facility]  08/12/2023                      wellness 11:00 AM Adela Glimpse, NP [facility]  11/17/2023                       cpe  2:00 PM Lucky Cowboy, MD [facility]            This very nice 72 y.o. MWM with  labile HTN, HLD, Prediabetes, Testosterone Deficiency  and Vitamin D Deficiency who  presents for a Screening /Preventative Visit & comprehensive evaluation and management of multiple medical co-morbidities.   Patient has hx/o GERD controlled on his meds.       Labile HTN predates circa 2015 .   Patient's BP has been controlled at home.  Today's BP is at goal - 110/68 .  Patient has CKD2 (GFR 78) . Patient denies any cardiac symptoms as chest pain, palpitations, shortness of breath, dizziness or ankle swelling.       Patient's hyperlipidemia is controlled with diet and Crestor. Patient denies myalgias or other medication SE's. Last lipids were  at goal :  Lab Results  Component Value Date   CHOL 144 08/08/2022   HDL 36 (L) 08/08/2022   LDLCALC 87 08/08/2022   TRIG 118 08/08/2022   CHOLHDL 4.0 08/08/2022                                             Patient is on Parenteral Testosterone replacement every 10 days  and he relates on therapy he has an improved sense of well being.         Patient has hx/o abn glucose  and patient denies reactive hypoglycemic symptoms, visual blurring, diabetic polys or paresthesias. Last A1c was normal & at goal :   Lab Results  Component Value Date   HGBA1C 5.5 04/09/2022         Finally, patient has history of Vitamin D Deficiency and last vitamin D was at goal :   Lab Results  Component Value Date   VD25OH 55 08/08/2022       Current Outpatient Medications  Medication Instructions    co-enzyme Q-10 30 mg, Oral, 3 times  daily    Omega-3 FISH OIL   1200 MG  3 times daily   AFRIN  nasal spray 1 spray, Each Nare, 2 times daily   pantoprazole 40 MG tablet TAKE 1 TABLET  DAILY    rosuvastatin  20 MG tablet Take  1 tablet  Daily     terazosin 10 MG capsule Take  1 capsule at Bedtime    testosterone cypio  200 MG/ML inj INJECT INTO THE MUSCLE EVERY WEEK    No Known Allergies   There is no immunization history on file for this patient. -  Patient refuses any  & all Vaccinations  Last Colon - Patient refuses   He did agree to  Cologard - 10/28/2017 Negative   03/17/2021   Repeat Cologard - Positive   07/11/2021  Colonoscopy - Dr Myrtie Neither - 3 polyps -  Recc 1 year f/u Colon  06/11/2022 - Dr Myrtie Neither sent patient reminder letter to schedule colonoscopy    Social History   Tobacco Use   Smoking status: Former   Smokeless tobacco: Never  Substance Use Topics   Alcohol use: No     ROS  Constitutional: Denies fever, chills, weight loss/gain, headaches, insomnia,  night sweats or change in appetite. Does c/o fatigue. Eyes: Denies redness, blurred vision, diplopia, discharge, itchy or watery eyes.  ENT: Denies discharge, congestion, post nasal drip, epistaxis, sore throat, earache, hearing loss, dental pain, Tinnitus, Vertigo, Sinus pain or snoring.  Cardio: Denies chest pain, palpitations, irregular heartbeat, syncope, dyspnea, diaphoresis, orthopnea, PND, claudication or edema Respiratory: denies cough, dyspnea, DOE, pleurisy, hoarseness, laryngitis or wheezing.  Gastrointestinal: Denies dysphagia, heartburn, reflux, water brash, pain, cramps, nausea, vomiting, bloating, diarrhea, constipation, hematemesis, melena, hematochezia, jaundice or hemorrhoids Genitourinary: Denies dysuria, frequency, discharge, hematuria or flank pain. Has urgency, nocturia x 2-3 & occasional hesitancy. Musculoskeletal: Denies arthralgia, myalgia, stiffness, Jt. Swelling, pain, limp or strain/sprain. Denies Falls. Skin: Denies  puritis, rash, hives, warts, acne, eczema or change in skin lesion Neuro: No weakness, tremor, incoordination, spasms, paresthesia or pain Psychiatric: Denies confusion, memory loss or sensory loss. Denies Depression. Endocrine: Denies change in weight, skin, hair change, nocturia, and paresthesia, diabetic polys, visual blurring or hyper / hypo glycemic episodes.  Heme/Lymph: No excessive bleeding, bruising or enlarged lymph nodes.   Physical Exam  BP 110/68   Pulse 90   Temp 97.9 F (36.6 C)   Resp 16   Ht 5\' 9"  (1.753 m)   Wt 168 lb (76.2 kg)   SpO2 98%   BMI 24.81 kg/m   General Appearance: Well nourished and well groomed and in no apparent distress.  Eyes: PERRLA, EOMs, conjunctiva no swelling or erythema, normal fundi and vessels. Sinuses: No frontal/maxillary tenderness ENT/Mouth: EACs patent / TMs  nl. Nares clear without erythema, swelling, mucoid exudates. Oral hygiene is good. No erythema, swelling, or exudate. Tongue normal, non-obstructing. Tonsils not swollen or erythematous. Hearing normal.  Neck: Supple, thyroid not palpable. No bruits, nodes or JVD. Respiratory: Respiratory effort normal.  BS equal and clear bilateral without rales, rhonci, wheezing or stridor. Cardio: Heart sounds are normal with regular rate and rhythm and no murmurs, rubs or gallops. Peripheral pulses are normal and equal bilaterally without edema. No aortic or femoral bruits. Chest: symmetric with normal excursions and percussion.  Abdomen: Soft, with Nl bowel sounds. Nontender, no guarding, rebound, hernias, masses, or organomegaly.  Lymphatics: Non tender without lymphadenopathy.  Musculoskeletal: Full ROM all peripheral extremities, joint stability, 5/5 strength, and normal gait. Skin: Warm and dry without rashes, lesions, cyanosis, clubbing or  ecchymosis.  Neuro: Cranial nerves intact, reflexes equal bilaterally. Normal muscle tone, no cerebellar symptoms. Sensation intact.  Pysch: Alert  and oriented X 3 with normal affect, insight and judgment appropriate.   Assessment and Plan  1. Annual Preventative/Screening Exam    2. Labile hypertension  - EKG 12-Lead - [place], RETROPERITNL ABD,  LTD - Urinalysis, Routine w reflex microscopic - Microalbumin / creatinine urine ratio - CBC with Differential/Platelet - COMPLETE METABOLIC PANEL WITH GFR - Magnesium - TSH   3. Hyperlipidemia, mixed  - EKG 12-Lead - [place], RETROPERITNL ABD,  LTD - Lipid panel - TSH   4. Abnormal glucose  - EKG 12-Lead - [place], RETROPERITNL ABD,  LTD - Hemoglobin A1c - Insulin, random  5. Vitamin D  deficiency  - VITAMIN D 25 Hydroxy   6. CKD (chronic kidney disease) stage 2, GFR 60-89 ml/min  - Urinalysis, Routine w reflex microscopic - Microalbumin / creatinine urine ratio - COMPLETE METABOLIC PANEL WITH GFR   7. Testosterone deficiency  - Testosterone   8. Adenomatous polyp of colon, unspecified part of colon  - Ambulatory referral to Gastroenterology   9. Screening for colorectal cancer  - Ambulatory referral to Gastroenterology   10. BPH with obstruction/lower urinary tract symptoms  - PSA   11. Prostate cancer screening  - PSA   12. Screening for heart disease  - EKG 12-Lead   13. FHx: heart disease  - EKG 12-Lead - [place], RETROPERITNL ABD,  LTD   14. Former smoker  - EKG 12-Lead - [place], RETROPERITNL ABD,  LTD   15. Screening for AAA (aortic abdominal aneurysm)  - [place], RETROPERITNL ABD,  LTD   16. Medication management  - Urinalysis, Routine w reflex microscopic - Microalbumin / creatinine urine ratio - Testosterone - CBC with Differential/Platelet - COMPLETE METABOLIC PANEL WITH GFR - Magnesium - Lipid panel - TSH - Hemoglobin A1c - Insulin, random - VITAMIN D 25 Hydroxy            Patient was counseled in prudent diet, weight control to achieve/maintain BMI less than 25, BP monitoring, regular exercise and medications as discussed.   Discussed med effects and SE's. Routine screening labs and tests as requested with regular follow-up as recommended. Over 40 minutes of exam, counseling, chart review and high complex critical decision making was performed   Marinus Maw, MD

## 2022-11-10 ENCOUNTER — Ambulatory Visit (INDEPENDENT_AMBULATORY_CARE_PROVIDER_SITE_OTHER): Payer: HMO | Admitting: Internal Medicine

## 2022-11-10 ENCOUNTER — Encounter: Payer: Self-pay | Admitting: Internal Medicine

## 2022-11-10 VITALS — BP 110/68 | HR 90 | Temp 97.9°F | Resp 16 | Ht 69.0 in | Wt 168.0 lb

## 2022-11-10 DIAGNOSIS — Z125 Encounter for screening for malignant neoplasm of prostate: Secondary | ICD-10-CM | POA: Diagnosis not present

## 2022-11-10 DIAGNOSIS — I700 Atherosclerosis of aorta: Secondary | ICD-10-CM | POA: Diagnosis not present

## 2022-11-10 DIAGNOSIS — Z136 Encounter for screening for cardiovascular disorders: Secondary | ICD-10-CM | POA: Diagnosis not present

## 2022-11-10 DIAGNOSIS — E782 Mixed hyperlipidemia: Secondary | ICD-10-CM

## 2022-11-10 DIAGNOSIS — Z1211 Encounter for screening for malignant neoplasm of colon: Secondary | ICD-10-CM

## 2022-11-10 DIAGNOSIS — N138 Other obstructive and reflux uropathy: Secondary | ICD-10-CM | POA: Diagnosis not present

## 2022-11-10 DIAGNOSIS — D126 Benign neoplasm of colon, unspecified: Secondary | ICD-10-CM

## 2022-11-10 DIAGNOSIS — Z8249 Family history of ischemic heart disease and other diseases of the circulatory system: Secondary | ICD-10-CM

## 2022-11-10 DIAGNOSIS — Z87891 Personal history of nicotine dependence: Secondary | ICD-10-CM

## 2022-11-10 DIAGNOSIS — N401 Enlarged prostate with lower urinary tract symptoms: Secondary | ICD-10-CM | POA: Diagnosis not present

## 2022-11-10 DIAGNOSIS — N182 Chronic kidney disease, stage 2 (mild): Secondary | ICD-10-CM

## 2022-11-10 DIAGNOSIS — Z0001 Encounter for general adult medical examination with abnormal findings: Secondary | ICD-10-CM

## 2022-11-10 DIAGNOSIS — R0989 Other specified symptoms and signs involving the circulatory and respiratory systems: Secondary | ICD-10-CM

## 2022-11-10 DIAGNOSIS — R7309 Other abnormal glucose: Secondary | ICD-10-CM | POA: Diagnosis not present

## 2022-11-10 DIAGNOSIS — Z0000 Encounter for general adult medical examination without abnormal findings: Secondary | ICD-10-CM

## 2022-11-10 DIAGNOSIS — E559 Vitamin D deficiency, unspecified: Secondary | ICD-10-CM | POA: Diagnosis not present

## 2022-11-10 DIAGNOSIS — Z79899 Other long term (current) drug therapy: Secondary | ICD-10-CM

## 2022-11-10 DIAGNOSIS — E349 Endocrine disorder, unspecified: Secondary | ICD-10-CM

## 2022-11-11 NOTE — Progress Notes (Signed)
^<^<^<^<^<^<^<^<^<^<^<^<^<^<^<^<^<^<^<^<^<^<^<^<^<^<^<^<^<^<^<^<^<^<^<^<^ ^>^>^>^>^>^>^>^>^>^>^>>^>^>^>^>^>^>^>^>^>^>^>^>^>^>^>^>^>^>^>^>^>^>^>^>^>  -    Chol = 130  - Wonderful  !   - Very low risk for Heart Attack  / Stroke  - Please continue  Rosuvastatin / Crestor  same  !   ^>^>^>^>^>^>^>^>^>^>^>^>^>^>^>^>^>^>^>^>^>^>^>^>^>^>^>^>^>^>^>^>^>^>^>^>^ ^>^>^>^>^>^>^>^>^>^>^>^>^>^>^>^>^>^>^>^>^>^>^>^>^>^>^>^>^>^>^>^>^>^>^>^>^  -   PSA =- very low  - No Prostate Cancer  - Great  !   ^<^<^<^<^<^<^<^<^<^<^<^<^<^<^<^<^<^<^<^<^<^<^<^<^<^<^<^<^<^<^<^<^<^<^<^<^ ^>^>^>^>^>^>^>^>^>^>^>^>^>^>^>^>^>^>^>^>^>^>^>^>^>^>^>^>^>^>^>^>^>^>^>^>^  -  Testosterone  - Normal  ^<^<^<^<^<^<^<^<^<^<^<^<^<^<^<^<^<^<^<^<^<^<^<^<^<^<^<^<^<^<^<^<^<^<^<^<^ ^>^>^>^>^>^>^>^>^>^>^>^>^>^>^>^>^>^>^>^>^>^>^>^>^>^>^>^>^>^>^>^>^>^>^>^>^  -  A1c - Normal - No Diabetes  - Great  !  ^<^<^<^<^<^<^<^<^<^<^<^<^<^<^<^<^<^<^<^<^<^<^<^<^<^<^<^<^<^<^<^<^<^<^<^<^ ^>^>^>^>^>^>^>^>^>^>^>^>^>^>^>^>^>^>^>^>^>^>^>^>^>^>^>^>^>^>^>^>^>^>^>^>^  -  Vitamin D = 61  - Great   ^<^<^<^<^<^<^<^<^<^<^<^<^<^<^<^<^<^<^<^<^<^<^<^<^<^<^<^<^<^<^<^<^<^<^<^<^ ^>^>^>^>^>^>^>^>^>^>^>^>^>^>^>^>^>^>^>^>^>^>^>^>^>^>^>^>^>^>^>^>^>^>^>^>^  -  All else OK   ^<^<^<^<^<^<^<^<^<^<^<^<^<^<^<^<^<^<^<^<^<^<^<^<^<^<^<^<^<^<^<^<^<^<^<^<^ ^>^>^>^>^>^>^>^>^>^>^>^>^>^>^>^>^>^>^>^>^>^>^>^>^>^>^>^>^>^>^>^>^>^>^>^>^  -

## 2023-01-12 ENCOUNTER — Other Ambulatory Visit: Payer: Self-pay | Admitting: Nurse Practitioner

## 2023-02-11 ENCOUNTER — Ambulatory Visit: Payer: HMO | Admitting: Nurse Practitioner

## 2023-02-12 ENCOUNTER — Encounter: Payer: Self-pay | Admitting: Nurse Practitioner

## 2023-02-12 ENCOUNTER — Ambulatory Visit: Payer: HMO | Admitting: Nurse Practitioner

## 2023-02-12 VITALS — BP 120/64 | HR 75 | Temp 98.2°F | Ht 69.0 in | Wt 170.2 lb

## 2023-02-12 DIAGNOSIS — E782 Mixed hyperlipidemia: Secondary | ICD-10-CM | POA: Diagnosis not present

## 2023-02-12 DIAGNOSIS — Z6826 Body mass index (BMI) 26.0-26.9, adult: Secondary | ICD-10-CM

## 2023-02-12 DIAGNOSIS — E349 Endocrine disorder, unspecified: Secondary | ICD-10-CM | POA: Diagnosis not present

## 2023-02-12 DIAGNOSIS — I444 Left anterior fascicular block: Secondary | ICD-10-CM

## 2023-02-12 DIAGNOSIS — G8921 Chronic pain due to trauma: Secondary | ICD-10-CM

## 2023-02-12 DIAGNOSIS — R0989 Other specified symptoms and signs involving the circulatory and respiratory systems: Secondary | ICD-10-CM

## 2023-02-12 DIAGNOSIS — Z79899 Other long term (current) drug therapy: Secondary | ICD-10-CM

## 2023-02-12 DIAGNOSIS — E559 Vitamin D deficiency, unspecified: Secondary | ICD-10-CM

## 2023-02-12 DIAGNOSIS — N183 Chronic kidney disease, stage 3 unspecified: Secondary | ICD-10-CM

## 2023-02-12 DIAGNOSIS — K219 Gastro-esophageal reflux disease without esophagitis: Secondary | ICD-10-CM

## 2023-02-12 NOTE — Progress Notes (Signed)
FOLLOW UP Assessment:    Labile hypertension Discussed DASH (Dietary Approaches to Stop Hypertension) DASH diet is lower in sodium than a typical American diet. Cut back on foods that are high in saturated fat, cholesterol, and trans fats. Eat more whole-grain foods, fish, poultry, and nuts Remain active and exercise as tolerated daily.  Monitor BP at home-Call if greater than 130/80.  Check CMP/CBC  Left anterior fascicular block Monitor; EKG at CPE, no rate controlling drugs; refer to cardiology if progresses  Hyperlipidemia, mixed Discussed lifestyle modifications. Recommended diet heavy in fruits and veggies, omega 3's. Decrease consumption of animal meats, cheeses, and dairy products. Remain active and exercise as tolerated. Continue to monitor. Check lipids/TSH  Vitamin D deficiency Continue supplementation for goal of 60-100 At goal last check; defer vitamin D level  Testosterone deficiency continue to monitor, states medication is helping with symptoms of low T.   Acid reflux No suspected reflux complications (Barret/stricture). Lifestyle modification:  wt loss, avoid meals 2-3h before bedtime. Consider eliminating food triggers:  chocolate, caffeine, EtOH, acid/spicy food.  Chronic pain due to trauma Lower back pain; well managed by chiropractor visits and regular exercise  BMI 25.0-25.9,adult Discussed appropriate BMI Diet modification. Physical activity. Encouraged/praised to build confidence.  CKD III [facility]) Discussed how what you eat and drink can aide in kidney protection. Stay well hydrated. Avoid high salt foods. Avoid NSAIDS. Keep BP and BG well controlled.   Take medications as prescribed. Remain active and exercise as tolerated daily. Maintain weight.  Continue to monitor. Check CMP/GFR/Microablumin  Medication management All medications discussed and reviewed in full. All questions and concerns regarding medications addressed.    Review  of lab work stable - patient plans to defer until NOV.  Notify office for further evaluation and treatment, questions or concerns if any reported s/s fail to improve.   The patient was advised to call back or seek an in-person evaluation if any symptoms worsen or if the condition fails to improve as anticipated.   Further disposition pending results of labs. Discussed med's effects and SE's.    I discussed the assessment and treatment plan with the patient. The patient was provided an opportunity to ask questions and all were answered. The patient agreed with the plan and demonstrated an understanding of the instructions.  Discussed med's effects and SE's. Screening labs and tests as requested with regular follow-up as recommended.  I provided 30 minutes of face-to-face time during this encounter including counseling, chart review, and critical decision making was preformed.  Today's Plan of Care is based on a patient-centered health care approach known as shared decision making - the decisions, tests and treatments allow for patient preferences and values to be balanced with clinical evidence.    Future Appointments  Date Time Provider Department Center  05/21/2023  3:30 PM Lucky Cowboy, MD [facility] None  08/12/2023 11:00 AM Adela Glimpse, NP [facility] None  11/17/2023  2:00 PM Lucky Cowboy, MD [facility] None    Subjective:  Christian Dixon is a 72 y.o. male presents for a general visit and follow up for chronic medication conditions.   Overall he reports feeling well today.      he has a diagnosis of reflux which is currently managed by pantopazole 40 mg daily, did famotidine taper last year failed.  he reports symptoms is currently well controlled, and denies breakthrough reflux, burning in chest, hoarseness or cough.    BMI is Body mass index is 25.13 kg/m., he has been  working on diet and exercise. Martial arts 4 days a week.  Wt Readings from Last 3 Encounters:   02/12/23 170 lb 3.2 oz (77.2 kg)  11/10/22 168 lb (76.2 kg)  08/08/22 174 lb 6.4 oz (79.1 kg)   His blood pressure has been controlled at home, today their BP is BP: 120/64  He does workout. He denies chest pain, shortness of breath, dizziness.   He is on cholesterol medication (rosuvastatin 20 mg daily) and denies myalgias. His cholesterol is not at goal. The cholesterol last visit was:   Lab Results  Component Value Date   CHOL 130 11/10/2022   HDL 34 (L) 11/10/2022   LDLCALC 75 11/10/2022   TRIG 131 11/10/2022   CHOLHDL 3.8 11/10/2022    He has been working on diet and exercise for glucose management, and denies nausea, paresthesia of the feet, polydipsia, polyuria, visual disturbances and vomiting. Last A1C in the office was:  Lab Results  Component Value Date   HGBA1C 5.5 11/10/2022   Lab Results  Component Value Date   GFRNONAA 69 10/02/2020   Patient is on Vitamin D supplement.   Lab Results  Component Value Date   VD25OH 69 11/10/2022     He has a history of testosterone deficiency and is on testosterone replacement, taking 400 mg every 2 weeks, last shot was 8 days ago. He states that the testosterone helps with his energy, libido, muscle mass. Lab Results  Component Value Date   TESTOSTERONE 575 11/10/2022   He is on hytrin for BP and LUTS; nocturia x 2 most nights Lab Results  Component Value Date   PSA 1.22 11/10/2022   PSA 0.44 03/06/2021   PSA 0.45 02/20/2020     Medication Review:  Current Outpatient Medications (Endocrine & Metabolic):    testosterone cypionate (DEPOTESTOSTERONE CYPIONATE) 200 MG/ML injection, INJECT INTO THE MUSCLE EVERY WEEK  Current Outpatient Medications (Cardiovascular):    rosuvastatin (CRESTOR) 20 MG tablet, Take  1 tablet  Daily  for Cholesterol                                                                               /                                                                   TAKE                                          BY                                                 MOUTH   terazosin (HYTRIN) 10 MG capsule, Take  1 capsule at Bedtime for Prostate                                                                           /  TAKE                                         BY                                                 MOUTH  Current Outpatient Medications (Respiratory):    oxymetazoline (AFRIN) 0.05 % nasal spray, Place 1 spray into both nostrils as needed.  Current Outpatient Medications (Analgesics):    meloxicam (MOBIC) 15 MG tablet, Take 1/2 to 1 tablet Daily with Food for Pain & Inflammation & limit to 5 days /week to avoid Kidney Damage (Patient not taking: Reported on 02/12/2023)   Current Outpatient Medications (Other):    cholecalciferol (VITAMIN D) 1000 units tablet, Take 1,000 Units by mouth daily.   co-enzyme Q-10 30 MG capsule, Take 30 mg by mouth 3 (three) times daily.   Omega-3 Fatty Acids (FISH OIL) 1200 MG CAPS, Take by mouth 3 (three) times daily.   pantoprazole (PROTONIX) 40 MG tablet, TAKE 1 TABLET BY MOUTH ONCE DAILY TO  PREVENT  HEARTBURN  AND  INDIGESTION.   SYRINGE-NEEDLE, DISP, 3 ML (B-D 3CC LUER-LOK SYR 21GX1") 21G X 1" 3 ML MISC, USE INTRAMUSCULARLY EVERY 7 DAYS AS DIRECTED  Allergies: No Known Allergies  Current Problems (verified) has Labile hypertension; Hyperlipidemia, mixed; Vitamin D deficiency; Testosterone deficiency; Chronic pain due to trauma; BMI 26.0-26.9,adult; Left anterior fascicular block (LAFB); Acid reflux; and CKD (chronic kidney disease) stage 2, GFR 60-89 ml/min on their problem list.   Patient Care Team: Lucky Cowboy, MD as PCP - General (Internal Medicine)  Surgical: He  has a past surgical history that includes Tonsillectomy; leg fracture (Right); left leg surgery; and Multiple tooth extractions. Family His family history is not on file. Social history  He reports that  he has quit smoking. He has never used smokeless tobacco. He reports current drug use. Drug: Marijuana. He reports that he does not drink alcohol.  Objective:   Today's Vitals   02/12/23 1534  BP: 120/64  Pulse: 75  Temp: 98.2 F (36.8 C)  SpO2: 99%  Weight: 170 lb 3.2 oz (77.2 kg)  Height: 5\' 9"  (1.753 m)    Body mass index is 25.13 kg/m.  General appearance: alert, no distress, WD/WN, male HEENT: normocephalic, sclerae anicteric, TMs pearly, nares patent, no discharge or erythema, pharynx normal Oral cavity: MMM, no lesions Neck: supple, no lymphadenopathy, no thyromegaly, no masses Heart: RRR, normal S1, S2, no murmurs Lungs: CTA bilaterally, no wheezes, rhonchi, or rales Abdomen: +bs, soft, non tender, non distended, no masses, no hepatomegaly, no splenomegaly Musculoskeletal: nontender, no swelling, no obvious deformity Extremities: no edema, no cyanosis, no clubbing Pulses: 2+ symmetric, upper and lower extremities, normal cap refill Neurological: alert, oriented x 3, CN2-12 intact, strength normal upper extremities and lower extremities, sensation normal throughout, DTRs 2+ throughout, no cerebellar signs, gait normal Psychiatric: normal affect, behavior normal, pleasant  or preventive services.    Adela Glimpse, NP   02/12/2023

## 2023-02-12 NOTE — Patient Instructions (Signed)

## 2023-03-03 ENCOUNTER — Other Ambulatory Visit: Payer: Self-pay | Admitting: Internal Medicine

## 2023-03-03 DIAGNOSIS — E349 Endocrine disorder, unspecified: Secondary | ICD-10-CM

## 2023-03-03 MED ORDER — TESTOSTERONE CYPIONATE 200 MG/ML IM SOLN
INTRAMUSCULAR | 1 refills | Status: AC
Start: 2023-03-03 — End: ?

## 2023-04-11 ENCOUNTER — Other Ambulatory Visit: Payer: Self-pay | Admitting: Nurse Practitioner

## 2023-05-20 ENCOUNTER — Ambulatory Visit: Payer: HMO | Admitting: Internal Medicine

## 2023-05-21 ENCOUNTER — Ambulatory Visit: Payer: HMO | Admitting: Internal Medicine

## 2023-08-12 ENCOUNTER — Ambulatory Visit: Payer: HMO | Admitting: Nurse Practitioner

## 2023-11-17 ENCOUNTER — Encounter: Payer: HMO | Admitting: Internal Medicine
